# Patient Record
Sex: Male | Born: 1960
Health system: Southern US, Community
[De-identification: ages and names within clinical notes are randomized; demographics above are authoritative.]

## PROBLEM LIST (undated history)

## (undated) DIAGNOSIS — M5124 Other intervertebral disc displacement, thoracic region: Secondary | ICD-10-CM

## (undated) DIAGNOSIS — Z87442 Personal history of urinary calculi: Secondary | ICD-10-CM

## (undated) DIAGNOSIS — M1990 Unspecified osteoarthritis, unspecified site: Secondary | ICD-10-CM

## (undated) DIAGNOSIS — K219 Gastro-esophageal reflux disease without esophagitis: Secondary | ICD-10-CM

## (undated) DIAGNOSIS — M549 Dorsalgia, unspecified: Secondary | ICD-10-CM

## (undated) DIAGNOSIS — J302 Other seasonal allergic rhinitis: Secondary | ICD-10-CM

## (undated) DIAGNOSIS — G2581 Restless legs syndrome: Secondary | ICD-10-CM

## (undated) DIAGNOSIS — G4730 Sleep apnea, unspecified: Secondary | ICD-10-CM

## (undated) DIAGNOSIS — G8929 Other chronic pain: Secondary | ICD-10-CM

## (undated) DIAGNOSIS — IMO0001 Reserved for inherently not codable concepts without codable children: Secondary | ICD-10-CM

## (undated) DIAGNOSIS — R7303 Prediabetes: Secondary | ICD-10-CM

## (undated) DIAGNOSIS — E119 Type 2 diabetes mellitus without complications: Secondary | ICD-10-CM

## (undated) HISTORY — PX: LITHOTRIPSY: SUR834

## (undated) HISTORY — PX: WISDOM TOOTH EXTRACTION: SHX21

## (undated) HISTORY — PX: ELBOW ARTHROSCOPY W/ SYNOVECTOMY: SHX1491

---

## 2001-11-01 ENCOUNTER — Ambulatory Visit (HOSPITAL_COMMUNITY): Admission: RE | Admit: 2001-11-01 | Discharge: 2001-11-01 | Payer: Self-pay | Admitting: Internal Medicine

## 2003-12-22 ENCOUNTER — Ambulatory Visit (HOSPITAL_COMMUNITY): Admission: RE | Admit: 2003-12-22 | Discharge: 2003-12-22 | Payer: Self-pay | Admitting: Pulmonary Disease

## 2004-10-20 ENCOUNTER — Emergency Department (HOSPITAL_COMMUNITY): Admission: EM | Admit: 2004-10-20 | Discharge: 2004-10-20 | Payer: Self-pay | Admitting: Emergency Medicine

## 2005-07-05 ENCOUNTER — Ambulatory Visit (HOSPITAL_COMMUNITY): Admission: RE | Admit: 2005-07-05 | Discharge: 2005-07-05 | Payer: Self-pay | Admitting: Pulmonary Disease

## 2005-10-13 ENCOUNTER — Emergency Department (HOSPITAL_COMMUNITY): Admission: EM | Admit: 2005-10-13 | Discharge: 2005-10-13 | Payer: Self-pay | Admitting: Emergency Medicine

## 2006-02-13 ENCOUNTER — Ambulatory Visit (HOSPITAL_COMMUNITY): Admission: RE | Admit: 2006-02-13 | Discharge: 2006-02-13 | Payer: Self-pay | Admitting: Pulmonary Disease

## 2007-04-15 ENCOUNTER — Emergency Department (HOSPITAL_COMMUNITY): Admission: EM | Admit: 2007-04-15 | Discharge: 2007-04-15 | Payer: Self-pay | Admitting: Emergency Medicine

## 2008-07-16 ENCOUNTER — Ambulatory Visit (HOSPITAL_COMMUNITY): Admission: RE | Admit: 2008-07-16 | Discharge: 2008-07-16 | Payer: Self-pay | Admitting: Urology

## 2008-07-17 ENCOUNTER — Ambulatory Visit (HOSPITAL_COMMUNITY): Admission: RE | Admit: 2008-07-17 | Discharge: 2008-07-17 | Payer: Self-pay | Admitting: Urology

## 2008-07-21 ENCOUNTER — Ambulatory Visit (HOSPITAL_COMMUNITY): Admission: RE | Admit: 2008-07-21 | Discharge: 2008-07-21 | Payer: Self-pay

## 2008-07-28 ENCOUNTER — Ambulatory Visit (HOSPITAL_COMMUNITY): Admission: RE | Admit: 2008-07-28 | Discharge: 2008-07-28 | Payer: Self-pay | Admitting: Urology

## 2009-01-02 ENCOUNTER — Ambulatory Visit: Admission: RE | Admit: 2009-01-02 | Discharge: 2009-01-02 | Payer: Self-pay | Admitting: Pulmonary Disease

## 2009-08-15 HISTORY — PX: CYSTOSCOPY/RETROGRADE/URETEROSCOPY/STONE EXTRACTION WITH BASKET: SHX5317

## 2010-12-31 NOTE — Procedures (Signed)
Jesus Ingram, Jesus Ingram             ACCOUNT NO.:  [id number]   MEDICAL RECORD NO.:  [id number]          PATIENT TYPE:  OUT   LOCATION:  SLEE                          FACILITY:  [facility]   PHYSICIAN:  Kofi A. Gerilyn Pilgrim, M.D. DATE OF BIRTH:  Aug 06, 1961   DATE OF PROCEDURE:  DATE OF DISCHARGE:  01/02/2009                             SLEEP DISORDER REPORT   NOCTURNAL POLYSOMNOGRAPHY REPORT.   REFERRING PHYSICIAN:  Edward L. Juanetta Gosling, MD   INDICATIONS:  This is a 50 year old who presents with snoring and  hypersomnia.  He is being evaluated for obstructive sleep apnea  syndrome.   MEDICATION:  Metformin, magnesium.   Epworth sleepiness scale of 13.  BMI of 32.   ARCHITECTURAL SUMMARY:  This is a split night recording with the first  portion being a diagnostic and the second a titration study.  The total  recording time is 404 minutes.  The sleep efficiency is 77%.  Sleep  latency is 18 minutes.  REM latency 240 minutes.   RESPIRATORY SUMMARY:  Baseline oxygen saturation is 95%.  Lowest  saturation 81%.  The diagnostic AHI is 80.  The patient was titrated  between pressures of 5 and 10 with the optimal pressure being 10, which  the patient tolerated very well.   LIMB MOVEMENT SUMMARY:  PLM index is 20.   ELECTROCARDIOGRAM SUMMARY:  Average heart rate 73 with rare PVCs  observed.   IMPRESSION:  1. Severe obstructive sleep apnea syndrome, which responds well to a      CPAP of 10.  2. Moderate periodic limb movement disorder of sleep.      Kofi A. Gerilyn Pilgrim, M.D.  Electronically Signed     KAD/MEDQ  D:  01/10/2009  T:  01/11/2009  Job:  [id number]

## 2010-12-31 NOTE — Op Note (Signed)
[facility]  Patient:    Jesus Ingram, FRASIER Visit Number: [id number] MRN: [id number]          Service Type: END Location: DAY Attending Physician:  Jonathon Bellows Dictated by:   Roetta Sessions, M.D. Proc. Date: 11/01/01 Admit Date:  11/01/2001 Discharge Date: 11/01/2001   CC:         Kari Baars, M.D.   Operative Report  PROCEDURE:  Diagnostic esophagogastroduodenoscopy.  INDICATIONS FOR PROCEDURE:  The patient is a pleasant 50 year old Caucasian male with long-standing gastroesophageal reflux symptoms.  He was intolerant to Aciphex.  He takes Rolaids every day.  Given the long-standing nature and recent crescendo characteristic to his reflux symptoms, EGD is now being performed. This patient was seen in the office yesterday at the request of Dr. Juanetta Gosling.  I discussed the approach of EGD with him.  Potential risks, benefits, and alternatives have been reviewed and questions answered.  He is agreeable.  Please see my handwritten H&P for more information.  He appeas low risk for conscious sedation.  PROCEDURE NOTE:  O2 saturation, blood pressure, and pulse for this patient were monitored for the entire procedure.  Conscious sedation with Versed 3 mg IV and Demerol 75 mg IV, and Cetacaine spray for topical oropharyngeal anesthesia.  INSTRUMENT:  Olympus video gastroscope.  FINDINGS:  Examination of the tubular esophagus revealed multiple distal esophageal erosions in a circumferential distribution.  They were all within 1 cm of the EGD junction.  There was no evidence of Barretts esophagus.  The remainder of the esophageal mucosa appeared normal.  The EGD junction was easily traversed.  Stomach:  The gastric cavity was emptied, insufflated with air, and thorough examination of the gastric mucosa including retroflex view of the proximal stomach and esophagogastric junction revealed no abnormalities.  The pylorus was patent and easily  traversed.  Duodenum: The duodenal bulb and second portion appeared normal.  THERAPEUTIC/DIAGNOSTIC MANEUVERS PERFORMED:  None.  The patient tolerated the procedure well.  IMPRESSION:  Distal esophageal erosions consistent with erosive reflux esophagitis  The remainder of the upper gastrointestinal tract appeared normal.  RECOMMENDATIONS: 1. Anti-reflux measures emphasized. 2. The patient is to go to my office for Nexium samples 40 mg orally daily    for the next month. 3. Office appointment with [place] in one month to assess his progress. Dictated by:   Roetta Sessions, M.D. Attending Physician:  Jonathon Bellows DD:  11/01/01 TD:  11/03/01 Job: [id number]

## 2011-10-12 LAB — HEMOGLOBIN A1C: Hemoglobin A1C: 6.6

## 2011-12-29 ENCOUNTER — Encounter (HOSPITAL_COMMUNITY): Payer: Self-pay

## 2012-01-03 ENCOUNTER — Encounter (HOSPITAL_COMMUNITY)
Admission: RE | Admit: 2012-01-03 | Discharge: 2012-01-03 | Disposition: A | Discharge status: Home / Self Care | Payer: 59 | Source: Ambulatory Visit | Attending: Ophthalmology | Admitting: Ophthalmology

## 2012-01-03 ENCOUNTER — Encounter (HOSPITAL_COMMUNITY): Payer: Self-pay

## 2012-01-03 HISTORY — DX: Gastro-esophageal reflux disease without esophagitis: K21.9

## 2012-01-03 HISTORY — DX: Personal history of urinary calculi: Z87.442

## 2012-01-03 HISTORY — DX: Other seasonal allergic rhinitis: J30.2

## 2012-01-03 HISTORY — DX: Other intervertebral disc displacement, thoracic region: M51.24

## 2012-01-03 HISTORY — DX: Dorsalgia, unspecified: M54.9

## 2012-01-03 HISTORY — DX: Other chronic pain: G89.29

## 2012-01-03 HISTORY — DX: Prediabetes: R73.03

## 2012-01-03 HISTORY — DX: Reserved for inherently not codable concepts without codable children: IMO0001

## 2012-01-03 LAB — BASIC METABOLIC PANEL
BUN: 10 mg/dL (ref 6–23)
CO2: 27 mEq/L (ref 19–32)
Calcium: 9.9 mg/dL (ref 8.4–10.5)
Chloride: 98 mEq/L (ref 96–112)
Creatinine, Ser: 1.14 mg/dL (ref 0.50–1.35)
GFR calc Af Amer: 86 mL/min — ABNORMAL LOW (ref >90)
GFR calc non Af Amer: 74 mL/min — ABNORMAL LOW (ref >90)
Glucose, Bld: 211 mg/dL — ABNORMAL HIGH (ref 70–99)
Potassium: 4.2 mEq/L (ref 3.5–5.1)

## 2012-01-03 LAB — HEMOGLOBIN AND HEMATOCRIT, BLOOD: Hemoglobin: 14.8 g/dL (ref 13.0–17.0)

## 2012-01-03 NOTE — Patient Instructions (Signed)
Your procedure is scheduled on:  Tuesday, 01/10/12  Report to [name] at     0630   AM.  Call this number if you have problems the morning of surgery: [phone]   Remember:   Do not eat or drink   After Midnight.  Take these medicines the morning of surgery with A SIP OF WATER:  none   Do not wear jewelry, make-up or nail polish.  Do not wear lotions, powders, or perfumes. You may wear deodorant.  Do not bring valuables to the hospital.  Contacts, dentures or bridgework may not be worn into surgery.     Patients discharged the day of surgery will not be allowed to drive home.  Name and phone number of your driver: driver  Special Instructions: Use eye drops as directed.   Please read over the following fact sheets that you were given: Pain Booklet, Anesthesia Post-op Instructions and Care and Recovery After Surgery    Cataract Surgery  A cataract is a clouding of the lens of the eye. When a lens becomes cloudy, vision is reduced based on the degree and nature of the clouding. Surgery may be needed to improve vision. Surgery removes the cloudy lens and usually replaces it with a substitute lens (intraocular lens, IOL). LET YOUR EYE DOCTOR KNOW ABOUT:  Allergies to food or medicine.   Medicines taken including herbs, eyedrops, over-the-counter medicines, and creams.   Use of steroids (by mouth or creams).   Previous problems with anesthetics or numbing medicine.   History of bleeding problems or blood clots.   Previous surgery.   Other health problems, including diabetes and kidney problems.   Possibility of pregnancy, if this applies.  RISKS AND COMPLICATIONS  Infection.   Inflammation of the eyeball (endophthalmitis) that can spread to both eyes (sympathetic ophthalmia).   Poor wound healing.   If an IOL is inserted, it can later fall out of proper position. This is very uncommon.   Clouding of the part of your eye that holds an IOL in place. This is called an  "after-cataract." These are uncommon, but easily treated.  BEFORE THE PROCEDURE  Do not eat or drink anything except small amounts of water for 8 to 12 before your surgery, or as directed by your caregiver.   Unless you are told otherwise, continue any eyedrops you have been prescribed.   Talk to your primary caregiver about all other medicines that you take (both prescription and non-prescription). In some cases, you may need to stop or change medicines near the time of your surgery. This is most important if you are taking blood-thinning medicine.Do not stop medicines unless you are told to do so.   Arrange for someone to drive you to and from the procedure.   Do not put contact lenses in either eye on the day of your surgery.  PROCEDURE There is more than one method for safely removing a cataract. Your doctor can explain the differences and help determine which is best for you. Phacoemulsification surgery is the most common form of cataract surgery.  An injection is given behind the eye or eyedrops are given to make this a painless procedure.   A small cut (incision) is made on the edge of the clear, dome-shaped surface that covers the front of the eye (cornea).   A tiny probe is painlessly inserted into the eye. This device gives off ultrasound waves that soften and break up the cloudy center of the lens. This makes  it easier for the cloudy lens to be removed by suction.   An IOL may be implanted.   The normal lens of the eye is covered by a clear capsule. Part of that capsule is intentionally left in the eye to support the IOL.   Your surgeon may or may not use stitches to close the incision.  There are other forms of cataract surgery that require a larger incision and stiches to close the eye. This approach is taken in cases where the doctor feels that the cataract cannot be easily removed using phacoemulsification. AFTER THE PROCEDURE  When an IOL is implanted, it does not need  care. It becomes a permanent part of your eye and cannot be seen or felt.   Your doctor will schedule follow-up exams to check on your progress.   Review your other medicines with your doctor to see which can be resumed after surgery.   Use eyedrops or take medicine as prescribed by your doctor.  Document Released: 07/21/2011 Document Reviewed: 07/18/2011 [facility] Patient Information 2012 [place], [place].  PATIENT INSTRUCTIONS POST-ANESTHESIA  IMMEDIATELY FOLLOWING SURGERY:  Do not drive or operate machinery for the first twenty four hours after surgery.  Do not make any important decisions for twenty four hours after surgery or while taking narcotic pain medications or sedatives.  If you develop intractable nausea and vomiting or a severe headache please notify your doctor immediately.  FOLLOW-UP:  Please make an appointment with your surgeon as instructed. You do not need to follow up with anesthesia unless specifically instructed to do so.  WOUND CARE INSTRUCTIONS (if applicable):  Keep a dry clean dressing on the anesthesia/puncture wound site if there is drainage.  Once the wound has quit draining you may leave it open to air.  Generally you should leave the bandage intact for twenty four hours unless there is drainage.  If the epidural site drains for more than 36-48 hours please call the anesthesia department.  QUESTIONS?:  Please feel free to call your physician or the hospital operator if you have any questions, and they will be happy to assist you.

## 2012-01-10 ENCOUNTER — Encounter (HOSPITAL_COMMUNITY): Payer: Self-pay | Admitting: *Deleted

## 2012-01-10 ENCOUNTER — Ambulatory Visit (HOSPITAL_COMMUNITY)
Admission: RE | Admit: 2012-01-10 | Discharge: 2012-01-10 | Disposition: A | Discharge status: Home / Self Care | Payer: 59 | Source: Ambulatory Visit | Attending: Ophthalmology | Admitting: Ophthalmology

## 2012-01-10 ENCOUNTER — Encounter (HOSPITAL_COMMUNITY): Payer: Self-pay | Admitting: Anesthesiology

## 2012-01-10 ENCOUNTER — Ambulatory Visit (HOSPITAL_COMMUNITY): Payer: 59 | Admitting: Anesthesiology

## 2012-01-10 ENCOUNTER — Encounter (HOSPITAL_COMMUNITY)
Admission: RE | Disposition: A | Discharge status: Home / Self Care | Payer: Self-pay | Source: Ambulatory Visit | Attending: Ophthalmology

## 2012-01-10 DIAGNOSIS — H2510 Age-related nuclear cataract, unspecified eye: Secondary | ICD-10-CM | POA: Insufficient documentation

## 2012-01-10 DIAGNOSIS — E119 Type 2 diabetes mellitus without complications: Secondary | ICD-10-CM | POA: Insufficient documentation

## 2012-01-10 DIAGNOSIS — Z01812 Encounter for preprocedural laboratory examination: Secondary | ICD-10-CM | POA: Insufficient documentation

## 2012-01-10 HISTORY — PX: CATARACT EXTRACTION W/PHACO: SHX586

## 2012-01-10 LAB — GLUCOSE, CAPILLARY: Glucose-Capillary: 111 mg/dL — ABNORMAL HIGH (ref 70–99)

## 2012-01-10 SURGERY — PHACOEMULSIFICATION, CATARACT, WITH IOL INSERTION
Anesthesia: Monitor Anesthesia Care | Site: Eye | Laterality: Right | Wound class: Clean

## 2012-01-10 MED ORDER — PHENYLEPHRINE HCL 2.5 % OP SOLN
1.0000 [drp] | OPHTHALMIC | Status: AC
  Administered 2012-01-10 (×3): 1 [drp] via OPHTHALMIC

## 2012-01-10 MED ORDER — CYCLOPENTOLATE-PHENYLEPHRINE 0.2-1 % OP SOLN
OPHTHALMIC | Status: AC
  Filled 2012-01-10: qty 2

## 2012-01-10 MED ORDER — PHENYLEPHRINE HCL 2.5 % OP SOLN
OPHTHALMIC | Status: AC
  Filled 2012-01-10: qty 2

## 2012-01-10 MED ORDER — CYCLOPENTOLATE-PHENYLEPHRINE 0.2-1 % OP SOLN
1.0000 [drp] | OPHTHALMIC | Status: AC
  Administered 2012-01-10 (×3): 1 [drp] via OPHTHALMIC

## 2012-01-10 MED ORDER — LIDOCAINE 3.5 % OP GEL OPTIME - NO CHARGE: OPHTHALMIC | Status: DC | PRN

## 2012-01-10 MED ORDER — TETRACAINE 0.5 % OP SOLN OPTIME - NO CHARGE
OPHTHALMIC | Status: DC | PRN
  Administered 2012-01-10: 2 [drp] via OPHTHALMIC

## 2012-01-10 MED ORDER — MIDAZOLAM HCL 2 MG/2ML IJ SOLN
INTRAMUSCULAR | Status: AC
  Administered 2012-01-10: 2 mg via INTRAVENOUS
  Filled 2012-01-10: qty 2

## 2012-01-10 MED ORDER — BSS IO SOLN
INTRAOCULAR | Status: DC | PRN
  Administered 2012-01-10: 15 mL via INTRAOCULAR

## 2012-01-10 MED ORDER — PROVISC 10 MG/ML IO SOLN
INTRAOCULAR | Status: DC | PRN
  Administered 2012-01-10: 8.5 mg via INTRAOCULAR

## 2012-01-10 MED ORDER — TETRACAINE HCL 0.5 % OP SOLN
1.0000 [drp] | OPHTHALMIC | Status: AC
  Administered 2012-01-10 (×3): 1 [drp] via OPHTHALMIC

## 2012-01-10 MED ORDER — LACTATED RINGERS IV SOLN
INTRAVENOUS | Status: DC
  Administered 2012-01-10: 08:00:00 via INTRAVENOUS

## 2012-01-10 MED ORDER — MIDAZOLAM HCL 2 MG/2ML IJ SOLN
1.0000 mg | INTRAMUSCULAR | Status: DC | PRN
Start: 2012-01-10 — End: 2012-01-10
  Administered 2012-01-10: 2 mg via INTRAVENOUS

## 2012-01-10 MED ORDER — FLURBIPROFEN SODIUM 0.03 % OP SOLN
OPHTHALMIC | Status: AC
  Filled 2012-01-10: qty 2.5

## 2012-01-10 MED ORDER — FLURBIPROFEN SODIUM 0.03 % OP SOLN
1.0000 [drp] | OPHTHALMIC | Status: AC
  Administered 2012-01-10 (×3): 1 [drp] via OPHTHALMIC

## 2012-01-10 MED ORDER — TETRACAINE HCL 0.5 % OP SOLN
OPHTHALMIC | Status: AC
  Filled 2012-01-10: qty 2

## 2012-01-10 MED ORDER — EPINEPHRINE HCL 1 MG/ML IJ SOLN
INTRAOCULAR | Status: DC | PRN
  Administered 2012-01-10: 08:00:00

## 2012-01-10 SURGICAL SUPPLY — 23 items
CAPSULAR TENSION RING-AMO (OPHTHALMIC RELATED) IMPLANT
CLOTH BEACON ORANGE TIMEOUT ST (SAFETY) ×1 IMPLANT
EYE SHIELD UNIVERSAL CLEAR (GAUZE/BANDAGES/DRESSINGS) ×2 IMPLANT
GLOVE BIO SURGEON STRL SZ 6.5 (GLOVE) ×1 IMPLANT
GLOVE ECLIPSE 6.5 STRL STRAW (GLOVE) IMPLANT
GLOVE ECLIPSE 7.0 STRL STRAW (GLOVE) IMPLANT
GLOVE EXAM NITRILE LRG STRL (GLOVE) IMPLANT
GLOVE EXAM NITRILE MD LF STRL (GLOVE) ×1 IMPLANT
GLOVE SKINSENSE NS SZ6.5 (GLOVE)
GLOVE SKINSENSE STRL SZ6.5 (GLOVE) IMPLANT
HEALON 5 0.6 ML (INTRAOCULAR LENS) IMPLANT
KIT VITRECTOMY (OPHTHALMIC RELATED) IMPLANT
PAD ARMBOARD 7.5X6 YLW CONV (MISCELLANEOUS) ×1 IMPLANT
PROC W NO LENS (INTRAOCULAR LENS)
PROC W SPEC LENS (INTRAOCULAR LENS)
PROCESS W NO LENS (INTRAOCULAR LENS) IMPLANT
PROCESS W SPEC LENS (INTRAOCULAR LENS) IMPLANT
RING MALYGIN (MISCELLANEOUS) IMPLANT
SIGHTPATH CAT PROC W REG LENS (Ophthalmic Related) ×2 IMPLANT
TAPE SURG TRANSPORE 1 IN (GAUZE/BANDAGES/DRESSINGS) IMPLANT
TAPE SURGICAL TRANSPORE 1 IN (GAUZE/BANDAGES/DRESSINGS) ×1
VISCOELASTIC ADDITIONAL (OPHTHALMIC RELATED) IMPLANT
WATER STERILE IRR 250ML POUR (IV SOLUTION) ×1 IMPLANT

## 2012-01-10 NOTE — Anesthesia Postprocedure Evaluation (Signed)
  Anesthesia Post-op Note  Patient: Jesus Ingram  Procedure(s) Performed: Procedure(s) (LRB): CATARACT EXTRACTION PHACO AND INTRAOCULAR LENS PLACEMENT (IOC) (Right)  Patient Location:  Short Stay  Anesthesia Type: MAC  Level of Consciousness: awake  Airway and Oxygen Therapy: Patient Spontanous Breathing  Post-op Pain: none  Post-op Assessment: Post-op Vital signs reviewed, Patient's Cardiovascular Status Stable, Respiratory Function Stable, Patent Airway, No signs of Nausea or vomiting and Pain level controlled  Post-op Vital Signs: Reviewed and stable  Complications: No apparent anesthesia complications

## 2012-01-10 NOTE — Op Note (Signed)
Patient brought to the operating room and prepped and draped in the usual manner.  Lid speculum inserted in right eye.  Stab incision made at the twelve o'clock position.  Provisc instilled in the anterior chamber.   A 2.4 mm. Stab incision was made temporally.  An anterior capsulotomy was done with a bent 25 gauge needle.  The nucleus was hydrodissected.  The Phaco tip was inserted in the anterior chamber and the nucleus was emulsified.  CDE was 9.77.  The cortical material was then removed with the I and A tip.  Posterior capsule was the polished.  The anterior chamber was deepened with Provisc.  A 21.0 Diopter Rayner 570C IOL was then inserted in the capsular bag.  Provisc was then removed with the I and A tip.  The wound was then hydrated.  Patient sent to the Recovery Room in good condition with follow up in my office.

## 2012-01-10 NOTE — Discharge Instructions (Signed)
Jesus Ingram  01/10/2012           [facility] Instructions [address] [place] [address]      1. Avoid closing eyes tightly. One often closes the eye tightly when laughing, talking, sneezing, coughing or if they feel irritated. At these times, you should be careful not to close your eyes tightly.  2. Instill eye drops as instructed. To instill drops in your eye, open it, look up and have someone gently pull the lower lid down and instill a couple of drops inside the lower lid.  3. Do not touch upper lid.  4. Take Advil or Tylenol for pain.  5. You may use either eye for near work, such as reading or sewing and you may watch television.  6. You may have your hair done at the beauty parlor at any time.  7. Wear dark glasses with or without your own glasses if you are in bright light.  8. Call our office at [phone] or [phone] if you have sharp pain in your eye or unusual symptoms.  9. Do not be concerned because vision in the operative eye is not good. It will not be good, no matter how successful the operation, until you get a special lens for it. Your old glasses will not be suited to the new eye that was operated on and you will not be ready for a new lens for about a month.  10. Follow up at the [facility] office today @ 2:30-3:00pm.    I have received a copy of the above instructions and will follow them.

## 2012-01-10 NOTE — H&P (Signed)
The patient was re examined and there is no change in the patients condition since the original H and P. 

## 2012-01-10 NOTE — Anesthesia Procedure Notes (Signed)
Procedure Name: MAC Date/Time: 01/10/2012 8:00 AM Performed by: Franco Nones Pre-anesthesia Checklist: Patient identified, Emergency Drugs available, Suction available, Timeout performed and Patient being monitored Patient Re-evaluated:Patient Re-evaluated prior to inductionOxygen Delivery Method: Nasal Cannula

## 2012-01-10 NOTE — Transfer of Care (Signed)
Immediate Anesthesia Transfer of Care Note  Patient: Jesus Ingram  Procedure(s) Performed: Procedure(s) (LRB): CATARACT EXTRACTION PHACO AND INTRAOCULAR LENS PLACEMENT (IOC) (Right)  Patient Location: Shortstay  Anesthesia Type: MAC  Level of Consciousness: awake  Airway & Oxygen Therapy: Patient Spontanous Breathing   Post-op Assessment: Report given to PACU RN, Post -op Vital signs reviewed and stable and Patient moving all extremities  Post vital signs: Reviewed and stable  Complications: No apparent anesthesia complications

## 2012-01-10 NOTE — Anesthesia Preprocedure Evaluation (Addendum)
Anesthesia Evaluation  Patient identified by MRN, date of birth, ID band Patient awake    History of Anesthesia Complications Negative for: history of anesthetic complications  Airway Mallampati: II      Dental  (+) Teeth Intact   Pulmonary neg pulmonary ROS,  breath sounds clear to auscultation        Cardiovascular negative cardio ROS  Rhythm:Regular     Neuro/Psych    GI/Hepatic GERD-  Medicated and Controlled,  Endo/Other  Diabetes mellitus-, Well Controlled, Type 2  Renal/GU      Musculoskeletal   Abdominal   Peds  Hematology   Anesthesia Other Findings   Reproductive/Obstetrics                          Anesthesia Physical Anesthesia Plan  ASA: II  Anesthesia Plan: MAC   Post-op Pain Management:    Induction: Intravenous  Airway Management Planned: Nasal Cannula  Additional Equipment:   Intra-op Plan:   Post-operative Plan:   Informed Consent: I have reviewed the patients History and Physical, chart, labs and discussed the procedure including the risks, benefits and alternatives for the proposed anesthesia with the patient or authorized representative who has indicated his/her understanding and acceptance.     Plan Discussed with:   Anesthesia Plan Comments:         Anesthesia Quick Evaluation

## 2012-01-12 ENCOUNTER — Encounter (HOSPITAL_COMMUNITY): Payer: Self-pay | Admitting: Ophthalmology

## 2012-03-20 LAB — LIPID PANEL
Cholesterol: 163 (ref 0–200)
HDL: 40 (ref 35–70)
LDL Cholesterol: 78
Triglycerides: 224 — AB (ref 40–160)

## 2012-03-20 LAB — BASIC METABOLIC PANEL
BUN: 15 (ref 4–21)
Creatinine: 1.1 (ref <=1.3)
Glucose: 164
Potassium: 4.4 (ref 3.4–5.3)
Sodium: 140 (ref 137–147)

## 2012-03-20 LAB — HEPATIC FUNCTION PANEL
ALT: 12 (ref 10–40)
AST: 13 — AB (ref 14–40)
Alkaline Phosphatase: 51 (ref 25–125)

## 2012-03-20 LAB — CBC AND DIFFERENTIAL
HCT: 43 (ref 41–53)
Hemoglobin: 15.1 (ref 13.5–17.5)
Platelets: 215 (ref 150–399)
WBC: 6.9

## 2012-03-20 LAB — PSA: PSA: 1.07

## 2012-03-20 LAB — HEMOGLOBIN A1C: Hemoglobin A1C: 6.3

## 2012-03-20 LAB — TSH: TSH: 1.34 (ref 0.41–5.90)

## 2012-03-20 LAB — COMPREHENSIVE METABOLIC PANEL: Calcium: 9.6 (ref 8.7–10.7)

## 2012-04-23 ENCOUNTER — Ambulatory Visit (HOSPITAL_COMMUNITY)
Admission: RE | Admit: 2012-04-23 | Discharge: 2012-04-23 | Disposition: A | Discharge status: Home / Self Care | Payer: 59 | Source: Ambulatory Visit | Attending: Pulmonary Disease | Admitting: Pulmonary Disease

## 2012-04-23 ENCOUNTER — Other Ambulatory Visit (HOSPITAL_COMMUNITY): Payer: Self-pay | Admitting: Pulmonary Disease

## 2012-04-23 DIAGNOSIS — M79673 Pain in unspecified foot: Secondary | ICD-10-CM

## 2012-04-23 DIAGNOSIS — M79609 Pain in unspecified limb: Secondary | ICD-10-CM | POA: Insufficient documentation

## 2012-04-23 DIAGNOSIS — M7730 Calcaneal spur, unspecified foot: Secondary | ICD-10-CM | POA: Insufficient documentation

## 2012-11-12 LAB — HEMOGLOBIN A1C: Hemoglobin A1C: 6.5

## 2012-11-20 ENCOUNTER — Encounter (INDEPENDENT_AMBULATORY_CARE_PROVIDER_SITE_OTHER): Payer: Self-pay | Admitting: *Deleted

## 2012-12-03 ENCOUNTER — Encounter (HOSPITAL_COMMUNITY): Payer: Self-pay | Admitting: Emergency Medicine

## 2012-12-03 ENCOUNTER — Emergency Department (HOSPITAL_COMMUNITY)
Admission: EM | Admit: 2012-12-03 | Discharge: 2012-12-04 | Disposition: A | Discharge status: Home / Self Care | Payer: 59 | Attending: Emergency Medicine | Admitting: Emergency Medicine

## 2012-12-03 ENCOUNTER — Emergency Department (HOSPITAL_COMMUNITY): Payer: 59

## 2012-12-03 DIAGNOSIS — Z8639 Personal history of other endocrine, nutritional and metabolic disease: Secondary | ICD-10-CM | POA: Insufficient documentation

## 2012-12-03 DIAGNOSIS — Z8739 Personal history of other diseases of the musculoskeletal system and connective tissue: Secondary | ICD-10-CM | POA: Insufficient documentation

## 2012-12-03 DIAGNOSIS — M542 Cervicalgia: Secondary | ICD-10-CM | POA: Insufficient documentation

## 2012-12-03 DIAGNOSIS — K219 Gastro-esophageal reflux disease without esophagitis: Secondary | ICD-10-CM | POA: Insufficient documentation

## 2012-12-03 DIAGNOSIS — R51 Headache: Secondary | ICD-10-CM | POA: Insufficient documentation

## 2012-12-03 DIAGNOSIS — M549 Dorsalgia, unspecified: Secondary | ICD-10-CM | POA: Insufficient documentation

## 2012-12-03 DIAGNOSIS — R1310 Dysphagia, unspecified: Secondary | ICD-10-CM | POA: Insufficient documentation

## 2012-12-03 DIAGNOSIS — Z87442 Personal history of urinary calculi: Secondary | ICD-10-CM | POA: Insufficient documentation

## 2012-12-03 DIAGNOSIS — Z862 Personal history of diseases of the blood and blood-forming organs and certain disorders involving the immune mechanism: Secondary | ICD-10-CM | POA: Insufficient documentation

## 2012-12-03 DIAGNOSIS — G8929 Other chronic pain: Secondary | ICD-10-CM | POA: Insufficient documentation

## 2012-12-03 LAB — CBC WITH DIFFERENTIAL/PLATELET
Basophils Relative: 0 % (ref 0–1)
Eosinophils Relative: 4 % (ref 0–5)
HCT: 43.9 % (ref 39.0–52.0)
Hemoglobin: 15.6 g/dL (ref 13.0–17.0)
MCH: 30.3 pg (ref 26.0–34.0)
MCHC: 35.5 g/dL (ref 30.0–36.0)
MCV: 85.2 fL (ref 78.0–100.0)
Monocytes Absolute: 0.6 10*3/uL (ref 0.1–1.0)
Monocytes Relative: 6 % (ref 3–12)
Neutro Abs: 7.6 10*3/uL (ref 1.7–7.7)

## 2012-12-03 LAB — BASIC METABOLIC PANEL
BUN: 13 mg/dL (ref 6–23)
Calcium: 9.4 mg/dL (ref 8.4–10.5)
Chloride: 99 mEq/L (ref 96–112)
Creatinine, Ser: 1.05 mg/dL (ref 0.50–1.35)
GFR calc Af Amer: >90 mL/min (ref >90)

## 2012-12-03 MED ORDER — DIAZEPAM 5 MG PO TABS
10.0000 mg | ORAL_TABLET | Freq: Once | ORAL | Status: AC
  Administered 2012-12-03: 10 mg via ORAL
  Filled 2012-12-03: qty 2

## 2012-12-03 MED ORDER — IOHEXOL 300 MG/ML  SOLN
75.0000 mL | Freq: Once | INTRAMUSCULAR | Status: AC | PRN
  Administered 2012-12-03: 75 mL via INTRAVENOUS

## 2012-12-03 MED ORDER — OXYCODONE-ACETAMINOPHEN 5-325 MG PO TABS
1.0000 | ORAL_TABLET | Freq: Once | ORAL | Status: AC
  Administered 2012-12-03: 1 via ORAL
  Filled 2012-12-03: qty 1

## 2012-12-03 MED ORDER — HYDROMORPHONE HCL PF 1 MG/ML IJ SOLN
1.0000 mg | Freq: Once | INTRAMUSCULAR | Status: AC
  Administered 2012-12-03: 1 mg via INTRAVENOUS
  Filled 2012-12-03: qty 1

## 2012-12-03 NOTE — ED Provider Notes (Signed)
History  This chart was scribed for Jesus Lennert, MD by Greggory Stallion, ED Scribe. This patient was seen in room [facility] and the patient's care was started at 9:05 PM.   CSN: [id number]  Arrival date & time 12/03/12  1826     Chief Complaint  Patient presents with  . Neck Pain     Patient is a 52 y.o. male presenting with neck pain. The history is provided by the patient. No language interpreter was used.  Neck Pain Pain location:  Occipital region Quality:  Shooting Pain radiates to:  R shoulder and L shoulder (chest) Pain severity:  Moderate Pain is:  Same all the time Onset quality:  Sudden Duration:  1 day Timing:  Constant Progression:  Unchanged Chronicity:  New Context: not fall, not jumping from heights, not lifting a heavy object, not MCA, not MVA, not pedestrian accident and not recent injury   Relieved by:  Nothing Worsened by:  Swallowing (movement) Associated symptoms: headaches   Associated symptoms: no chest pain     SEANPATRICK Ingram is a 52 y.o. male who presents to the Emergency Department complaining of constant, severe,  shooting, neck pain onset 1 day ago. Pt states the pain radiates to his bilateral shoulders and chest. Pt states he put ice on his neck. He states it hurts to swallow. Pt states he took Aleve the first day without relief. He states he took an antiinflammatory this morning without relief. Pt denies fever, sore throat, visual disturbance, CP, cough, SOB, abdominal pain, nausea, emesis, diarrhea, urinary symptoms, back pain, HA, weakness, numbness and rash as associated symptoms.     Past Medical History  Diagnosis Date  . Borderline diabetes   . History of renal calculi   . Reflux     occasional, uses TUMS  . Seasonal allergies   . Chronic back pain   . Ruptured disc, thoracic     Past Surgical History  Procedure Laterality Date  . Wisdom tooth extraction    . Cystoscopy/retrograde/ureteroscopy/stone extraction with basket   2011    with stents, [facility]; [facility]  . Lithotripsy    . Cataract extraction w/phaco  01/10/2012    Procedure: CATARACT EXTRACTION PHACO AND INTRAOCULAR LENS PLACEMENT (IOC);  Surgeon: Loraine Leriche T. Nile Riggs, MD;  Location: [facility];  Service: Ophthalmology;  Laterality: Right;  CDE 9.77    History reviewed. No pertinent family history.  History  Substance Use Topics  . Smoking status: Never Smoker   . Smokeless tobacco: Not on file  . Alcohol Use: No      Review of Systems  Constitutional: Negative for appetite change and fatigue.  HENT: Positive for neck pain. Negative for congestion, sinus pressure and ear discharge.   Eyes: Negative for discharge.  Respiratory: Negative for cough.   Cardiovascular: Negative for chest pain.  Gastrointestinal: Negative for abdominal pain and diarrhea.  Genitourinary: Negative for frequency and hematuria.  Musculoskeletal: Negative for back pain.  Skin: Negative for rash.  Neurological: Positive for headaches. Negative for seizures.  Psychiatric/Behavioral: Negative for hallucinations.    Allergies  Review of patient's allergies indicates no known allergies.  Home Medications   Current Outpatient Rx  Name  Route  Sig  Dispense  Refill  . acetaminophen (TYLENOL) 500 MG tablet   Oral   Take 500 mg by mouth every 6 (six) hours as needed. For pain         . Melatonin 3 MG TABS   Oral   Take  1 tablet by mouth at bedtime as needed. For sleep         . pseudoephedrine-guaifenesin (MUCINEX D) 60-600 MG per tablet   Oral   Take 1 tablet by mouth every 12 (twelve) hours as needed. For allergies           Triage Vitals: BP 153/97  Pulse 84  Temp(Src) 97.7 F (36.5 C) (Oral)  Resp 20  Ht 6' (1.829 m)  Wt 230 lb (104.327 kg)  BMI 31.19 kg/m2  SpO2 100%  Physical Exam  Nursing note and vitals reviewed. Constitutional: He is oriented to person, place, and time. He appears well-developed.  HENT:  Head: Normocephalic.  Eyes: Conjunctivae  and EOM are normal. No scleral icterus.  Neck: No thyromegaly present.  Tender posterior bilateral neck.  Pain has pain with flexion and lateral movement  Cardiovascular: Normal rate and regular rhythm.  Exam reveals no gallop and no friction rub.   No murmur heard. Pulmonary/Chest: No stridor. He has no wheezes. He has no rales. He exhibits no tenderness.  Abdominal: He exhibits no distension. There is no tenderness. There is no rebound.  Musculoskeletal: Normal range of motion. He exhibits no edema.  Lymphadenopathy:    He has no cervical adenopathy.  Neurological: He is oriented to person, place, and time. Coordination normal.  Skin: No rash noted. No erythema.  Psychiatric: He has a normal mood and affect. His behavior is normal.    ED Course  Procedures (including critical care time)  DIAGNOSTIC STUDIES: Oxygen Saturation is 100% on RA, normal by my interpretation.    COORDINATION OF CARE: 9:05 PM-Discussed treatment plan which includes  Medications  HYDROmorphone (DILAUDID) injection 1 mg (not administered)  diazepam (VALIUM) tablet 10 mg (10 mg Oral Given 12/03/12 1944)  oxyCODONE-acetaminophen (PERCOCET/ROXICET) 5-325 MG per tablet 1 tablet (1 tablet Oral Given 12/03/12 1944)    soft tissue neck CT, CBC, and BMP with pt at bedside and pt agreed to plan.   Pt complains of pain in head worse will get ct head  Labs Reviewed - No data to display No results found.   No diagnosis found.    MDM  Muscular skeletal neck pain.  With pain with swallowing.  Will treat symptoms and have pt follow up with his md in 1-2 days for recheck    The chart was scribed for me under my direct supervision.  I personally performed the history, physical, and medical decision making and all procedures in the evaluation of this patient.Jesus Lennert, MD 12/04/12 [phone]

## 2012-12-03 NOTE — ED Notes (Addendum)
Patient complaining of neck pain radiating into bilateral shoulders since waking up yesterday. States pain is worse today. Denies injury.

## 2012-12-04 MED ORDER — CYCLOBENZAPRINE HCL 10 MG PO TABS: 10.0000 mg | ORAL_TABLET | Freq: Three times a day (TID) | ORAL | Status: DC | PRN

## 2012-12-04 MED ORDER — HYDROMORPHONE HCL PF 1 MG/ML IJ SOLN
1.0000 mg | Freq: Once | INTRAMUSCULAR | Status: AC
  Administered 2012-12-04: 1 mg via INTRAVENOUS
  Filled 2012-12-04: qty 1

## 2012-12-04 MED ORDER — OXYCODONE-ACETAMINOPHEN 5-325 MG PO TABS: 1.0000 | ORAL_TABLET | Freq: Four times a day (QID) | ORAL | Status: DC | PRN

## 2013-02-20 LAB — BASIC METABOLIC PANEL
BUN: 14 (ref 4–21)
CO2: 27 — AB (ref 13–22)
Creatinine: 1.3 (ref <=1.3)
Glucose: 123
Potassium: 4.3 (ref 3.4–5.3)
Sodium: 138 (ref 137–147)

## 2013-02-20 LAB — LIPID PANEL
Cholesterol: 167 (ref 0–200)
HDL: 36 (ref 35–70)
LDL Cholesterol: 74
Triglycerides: 283 — AB (ref 40–160)

## 2013-02-20 LAB — CBC AND DIFFERENTIAL
HCT: 42 (ref 41–53)
Hemoglobin: 14.6 (ref 13.5–17.5)
Platelets: 236 (ref 150–399)
WBC: 9.4

## 2013-02-20 LAB — HEMOGLOBIN A1C: Hemoglobin A1C: 6.1

## 2013-02-20 LAB — TSH: TSH: 0.95 (ref 0.41–5.90)

## 2013-02-20 LAB — COMPREHENSIVE METABOLIC PANEL: Calcium: 9.2 (ref 8.7–10.7)

## 2013-03-06 ENCOUNTER — Telehealth (INDEPENDENT_AMBULATORY_CARE_PROVIDER_SITE_OTHER): Payer: Self-pay | Admitting: *Deleted

## 2013-03-06 ENCOUNTER — Encounter (INDEPENDENT_AMBULATORY_CARE_PROVIDER_SITE_OTHER): Payer: Self-pay | Admitting: *Deleted

## 2013-03-06 ENCOUNTER — Other Ambulatory Visit (INDEPENDENT_AMBULATORY_CARE_PROVIDER_SITE_OTHER): Payer: Self-pay | Admitting: *Deleted

## 2013-03-06 DIAGNOSIS — Z1211 Encounter for screening for malignant neoplasm of colon: Secondary | ICD-10-CM

## 2013-03-06 MED ORDER — PEG-KCL-NACL-NASULF-NA ASC-C 100 G PO SOLR: 1.0000 | Freq: Once | ORAL | Status: DC

## 2013-03-06 NOTE — Telephone Encounter (Signed)
Patient needs movi prep 

## 2013-03-28 ENCOUNTER — Telehealth (INDEPENDENT_AMBULATORY_CARE_PROVIDER_SITE_OTHER): Payer: Self-pay | Admitting: *Deleted

## 2013-03-28 NOTE — Telephone Encounter (Signed)
  Procedure: tcs  Reason/Indication:  screening  Has patient had this procedure before?  no  If so, when, by whom and where?    Is there a family history of colon cancer?  no  Who?  What age when diagnosed?    Is patient diabetic?   Yes, diet control      Does patient have prosthetic heart valve?  no  Do you have a pacemaker?  no  Has patient ever had endocarditis? no  Has patient had joint replacement within last 12 months?  no  Is patient on Coumadin, Plavix and/or Aspirin? no  Medications: advil prn  Allergies:   Medication Adjustment:   Procedure date & time: 04/25/13 at 830

## 2013-03-28 NOTE — Telephone Encounter (Signed)
Agree 

## 2013-04-25 ENCOUNTER — Encounter (HOSPITAL_COMMUNITY): Payer: Self-pay | Admitting: *Deleted

## 2013-04-25 ENCOUNTER — Encounter (HOSPITAL_COMMUNITY)
Admission: RE | Disposition: A | Discharge status: Home / Self Care | Payer: Self-pay | Source: Ambulatory Visit | Attending: Internal Medicine

## 2013-04-25 ENCOUNTER — Ambulatory Visit (HOSPITAL_COMMUNITY)
Admission: RE | Admit: 2013-04-25 | Discharge: 2013-04-25 | Disposition: A | Discharge status: Home / Self Care | Payer: 59 | Source: Ambulatory Visit | Attending: Internal Medicine | Admitting: Internal Medicine

## 2013-04-25 DIAGNOSIS — Z1211 Encounter for screening for malignant neoplasm of colon: Secondary | ICD-10-CM

## 2013-04-25 HISTORY — DX: Gastro-esophageal reflux disease without esophagitis: K21.9

## 2013-04-25 HISTORY — PX: COLONOSCOPY: SHX5424

## 2013-04-25 LAB — HM COLONOSCOPY

## 2013-04-25 SURGERY — COLONOSCOPY
Anesthesia: Moderate Sedation

## 2013-04-25 MED ORDER — MIDAZOLAM HCL 5 MG/5ML IJ SOLN
INTRAMUSCULAR | Status: DC | PRN
  Administered 2013-04-25 (×2): 2 mg via INTRAVENOUS
  Administered 2013-04-25 (×2): 1 mg via INTRAVENOUS

## 2013-04-25 MED ORDER — MEPERIDINE HCL 50 MG/ML IJ SOLN
INTRAMUSCULAR | Status: AC
  Filled 2013-04-25: qty 1

## 2013-04-25 MED ORDER — MIDAZOLAM HCL 5 MG/5ML IJ SOLN
INTRAMUSCULAR | Status: AC
  Filled 2013-04-25: qty 10

## 2013-04-25 MED ORDER — SODIUM CHLORIDE 0.9 % IV SOLN
INTRAVENOUS | Status: DC
  Administered 2013-04-25: 08:00:00 via INTRAVENOUS

## 2013-04-25 MED ORDER — MEPERIDINE HCL 50 MG/ML IJ SOLN
INTRAMUSCULAR | Status: DC | PRN
  Administered 2013-04-25 (×2): 25 mg via INTRAVENOUS

## 2013-04-25 MED ORDER — SIMETHICONE 40 MG/0.6ML PO SUSP
ORAL | Status: DC | PRN
  Administered 2013-04-25: 08:00:00

## 2013-04-25 NOTE — Op Note (Signed)
COLONOSCOPY PROCEDURE REPORT  PATIENT:  Jesus Ingram  MR#:  [id number] Birthdate:  [birth year], 52 y.o., male Endoscopist:  Dr. Malissa Hippo, MD Referred By:  Dr. Oneal Deputy. Juanetta Gosling, MD Procedure Date: 04/25/2013  Procedure:   Colonoscopy  Indications:  Patient is 52 year old Caucasian male who is undergoing average risk screening colonoscopy.  Informed Consent:  The procedure and risks were reviewed with the patient and informed consent was obtained.  Medications:  Demerol 50 mg IV Versed 6 mg IV  Description of procedure:  After a digital rectal exam was performed, that colonoscope was advanced from the anus through the rectum and colon to the area of the cecum, ileocecal valve and appendiceal orifice. The cecum was deeply intubated. These structures were well-seen and photographed for the record. From the level of the cecum and ileocecal valve, the scope was slowly and cautiously withdrawn. The mucosal surfaces were carefully surveyed utilizing scope tip to flexion to facilitate fold flattening as needed. The scope was pulled down into the rectum where a thorough exam including retroflexion was performed. Terminal ileum was also examined.  Findings:   Prep excellent. Normal mucosa of terminal ileum. Normal mucosa of colon and rectum. Unremarkable anal rectal junction. Prep execellent Prep satisfactory.   Therapeutic/Diagnostic Maneuvers Performed:  None  Complications:  None  Cecal Withdrawal Time:  10 minutes  Impression:  Normal mucosa of terminal ileum. Normal colonoscopy.  Recommendations:  Standard instructions given. Next screening exam in 10 years.  REHMAN,NAJEEB U  04/25/2013 9:01 AM  CC: Dr. Fredirick Maudlin, MD & Dr. Bonnetta Barry ref. provider found

## 2013-04-25 NOTE — H&P (Signed)
Jesus Ingram is an 51 y.o. male.   Chief Complaint: Patient's here for colonoscopy. HPI: Patient is 52 year old Caucasian male who is here for screening colonoscopy. This is patient's first exam. He denies abdominal pain change in his 5 habits or rectal bleeding. Family history is negative for CRC.  Past Medical History  Diagnosis Date  . Borderline diabetes   . History of renal calculi   . Reflux     occasional, uses TUMS  . Seasonal allergies   . Chronic back pain   . Ruptured disc, thoracic   . GERD (gastroesophageal reflux disease)     Past Surgical History  Procedure Laterality Date  . Wisdom tooth extraction    . Cystoscopy/retrograde/ureteroscopy/stone extraction with basket  2011    with stents, [facility]; [place]  . Lithotripsy    . Cataract extraction w/phaco  01/10/2012    Procedure: CATARACT EXTRACTION PHACO AND INTRAOCULAR LENS PLACEMENT (IOC);  Surgeon: Loraine Leriche T. Nile Riggs, MD;  Location: [facility];  Service: Ophthalmology;  Laterality: Right;  CDE 9.77    Family History  Problem Relation Age of Onset  . Colon cancer Neg Hx    Social History:  reports that he has never smoked. His smokeless tobacco use includes Chew. He reports that he does not drink alcohol or use illicit drugs.  Allergies: No Known Allergies  Medications Prior to Admission  Medication Sig Dispense Refill  . acetaminophen (TYLENOL) 500 MG tablet Take 500 mg by mouth every 6 (six) hours as needed. For pain      . Melatonin 3 MG TABS Take 1 tablet by mouth once a week. Only takes rarely as needed For sleep      . naproxen sodium (ALEVE) 220 MG tablet Take 220 mg by mouth once as needed (for pain).      . peg 3350 powder (MOVIPREP) 100 G SOLR Take 1 kit (100 g total) by mouth once.  1 kit  0  . cyclobenzaprine (FLEXERIL) 10 MG tablet Take 1 tablet (10 mg total) by mouth 3 (three) times daily as needed for muscle spasms.  20 tablet  0  . methocarbamol (ROBAXIN) 750 MG tablet Take 750 mg by mouth once as  needed (for pain).      Marland Kitchen oxyCODONE-acetaminophen (PERCOCET/ROXICET) 5-325 MG per tablet Take 1 tablet by mouth every 6 (six) hours as needed for pain.  20 tablet  0    No results found for this or any previous visit (from the past 48 hour(s)). No results found.  ROS  Blood pressure 122/76, temperature 97.9 F (36.6 C), temperature source Oral, resp. rate 11, height 6' (1.829 m), weight 228 lb (103.42 kg), SpO2 97.00%. Physical Exam  Constitutional: He appears well-developed and well-nourished.  HENT:  Mouth/Throat: Oropharynx is clear and moist.  Eyes: Conjunctivae are normal.  Neck: No thyromegaly present.  Cardiovascular: Normal rate, regular rhythm and normal heart sounds.   No murmur heard. Respiratory: Effort normal and breath sounds normal.  GI: Soft. He exhibits no distension and no mass. There is no tenderness.  Musculoskeletal: He exhibits no edema.  Lymphadenopathy:    He has no cervical adenopathy.  Neurological: He is alert.  Skin: Skin is warm and dry.     Assessment/Plan Average risk screening colonoscopy.  Shealyn Sean U 04/25/2013, 8:26 AM

## 2013-04-26 ENCOUNTER — Encounter (HOSPITAL_COMMUNITY): Payer: Self-pay | Admitting: Internal Medicine

## 2013-05-30 LAB — COMPREHENSIVE METABOLIC PANEL: Calcium: 9.5 (ref 8.7–10.7)

## 2013-05-30 LAB — BASIC METABOLIC PANEL
BUN: 16 (ref 4–21)
CO2: 28 — AB (ref 13–22)
Chloride: 102 (ref 99–108)
Creatinine: 1.1 (ref <=1.3)
Glucose: 113
Potassium: 4.4 (ref 3.4–5.3)
Sodium: 141 (ref 137–147)

## 2013-05-30 LAB — HEMOGLOBIN A1C: Hemoglobin A1C: 6.6

## 2013-11-21 LAB — HEMOGLOBIN A1C: Hemoglobin A1C: 6.3

## 2014-03-04 LAB — HEMOGLOBIN A1C: Hemoglobin A1C: 6.6

## 2014-10-03 LAB — HEMOGLOBIN A1C: Hemoglobin A1C: 6.5

## 2015-01-08 NOTE — Patient Instructions (Signed)
Your procedure is scheduled on:  5/231/2015  [facility] at  11:30    AM.  Call this number if you have problems the morning of surgery: [phone]   Remember:   Do not eat or drink :After Midnight.      Do not wear jewelry, make-up or nail polish.  Do not wear lotions, powders, or perfumes. You may wear deodorant.  Do not shave 48 hours prior to surgery.  Do not bring valuables to the hospital.  Contacts, dentures or bridgework may not be worn into surgery.  Patients discharged the day of surgery will not be allowed to drive home.  Name and phone number of your driver:    Please read over the following fact sheets that you were given: Pain Booklet, Surgical Site Infection Prevention, Anesthesia Post-op Instructions and Care and Recovery After Surgery  Cataract Surgery  A cataract is a clouding of the lens of the eye. When a lens becomes cloudy, vision is reduced based on the degree and nature of the clouding. Surgery may be needed to improve vision. Surgery removes the cloudy lens and usually replaces it with a substitute lens (intraocular lens, IOL). LET YOUR EYE DOCTOR KNOW ABOUT:  Allergies to food or medicine.   Medicines taken including herbs, eyedrops, over-the-counter medicines, and creams.   Use of steroids (by mouth or creams).   Previous problems with anesthetics or numbing medicine.   History of bleeding problems or blood clots.   Previous surgery.   Other health problems, including diabetes and kidney problems.   Possibility of pregnancy, if this applies.  RISKS AND COMPLICATIONS  Infection.   Inflammation of the eyeball (endophthalmitis) that can spread to both eyes (sympathetic ophthalmia).   Poor wound healing.   If an IOL is inserted, it can later fall out of proper position. This is very uncommon.   Clouding of the part of your eye that holds an IOL in place. This is called an "after-cataract." These are uncommon, but easily treated.    BEFORE THE PROCEDURE  Do not eat or drink anything except small amounts of water for 8 to 12 before your surgery, or as directed by your caregiver.   Unless you are told otherwise, continue any eyedrops you have been prescribed.   Talk to your primary caregiver about all other medicines that you take (both prescription and non-prescription). In some cases, you may need to stop or change medicines near the time of your surgery. This is most important if you are taking blood-thinning medicine.Do not stop medicines unless you are told to do so.   Arrange for someone to drive you to and from the procedure.   Do not put contact lenses in either eye on the day of your surgery.  PROCEDURE There is more than one method for safely removing a cataract. Your doctor can explain the differences and help determine which is best for you. Phacoemulsification surgery is the most common form of cataract surgery.  An injection is given behind the eye or eyedrops are given to make this a painless procedure.   A small cut (incision) is made on the edge of the clear, dome-shaped surface that covers the front of the eye (cornea).   A tiny probe is painlessly inserted into the eye. This device gives off ultrasound waves that soften and break up the cloudy center of the lens. This makes it easier for the cloudy lens to be removed by suction.   An IOL  may be implanted.   The normal lens of the eye is covered by a clear capsule. Part of that capsule is intentionally left in the eye to support the IOL.   Your surgeon may or may not use stitches to close the incision.  There are other forms of cataract surgery that require a larger incision and stiches to close the eye. This approach is taken in cases where the doctor feels that the cataract cannot be easily removed using phacoemulsification. AFTER THE PROCEDURE  When an IOL is implanted, it does not need care. It becomes a permanent part of your eye and cannot  be seen or felt.   Your doctor will schedule follow-up exams to check on your progress.   Review your other medicines with your doctor to see which can be resumed after surgery.   Use eyedrops or take medicine as prescribed by your doctor.  Document Released: 07/21/2011 Document Reviewed: 07/18/2011 [facility] Patient Information 2012 [place], [place].  .Cataract Surgery Care After Refer to this sheet in the next few weeks. These instructions provide you with information on caring for yourself after your procedure. Your caregiver may also give you more specific instructions. Your treatment has been planned according to current medical practices, but problems sometimes occur. Call your caregiver if you have any problems or questions after your procedure.  HOME CARE INSTRUCTIONS   Avoid strenuous activities as directed by your caregiver.   Ask your caregiver when you can resume driving.   Use eyedrops or other medicines to help healing and control pressure inside your eye as directed by your caregiver.   Only take over-the-counter or prescription medicines for pain, discomfort, or fever as directed by your caregiver.   Do not to touch or rub your eyes.   You may be instructed to use a protective shield during the first few days and nights after surgery. If not, wear sunglasses to protect your eyes. This is to protect the eye from pressure or from being accidentally bumped.   Keep the area around your eye clean and dry. Avoid swimming or allowing water to hit you directly in the face while showering. Keep soap and shampoo out of your eyes.   Do not bend or lift heavy objects. Bending increases pressure in the eye. You can walk, climb stairs, and do light household chores.   Do not put a contact lens into the eye that had surgery until your caregiver says it is okay to do so.   Ask your doctor when you can return to work. This will depend on the kind of work that you do. If you work in a  dusty environment, you may be advised to wear protective eyewear for a period of time.   Ask your caregiver when it will be safe to engage in sexual activity.   Continue with your regular eye exams as directed by your caregiver.  What to expect:  It is normal to feel itching and mild discomfort for a few days after cataract surgery. Some fluid discharge is also common, and your eye may be sensitive to light and touch.   After 1 to 2 days, even moderate discomfort should disappear. In most cases, healing will take about 6 weeks.   If you received an intraocular lens (IOL), you may notice that colors are very bright or have a blue tinge. Also, if you have been in bright sunlight, everything may appear reddish for a few hours. If you see these color tinges, it  is because your lens is clear and no longer cloudy. Within a few months after receiving an IOL, these extra colors should go away. When you have healed, you will probably need new glasses.  SEEK MEDICAL CARE IF:   You have increased bruising around your eye.   You have discomfort not helped by medicine.  SEEK IMMEDIATE MEDICAL CARE IF:   You have a fever.   You have a worsening or sudden vision loss.   You have redness, swelling, or increasing pain in the eye.   You have a thick discharge from the eye that had surgery.  MAKE SURE YOU:  Understand these instructions.   Will watch your condition.   Will get help right away if you are not doing well or get worse.  Document Released: 02/18/2005 Document Revised: 07/21/2011 Document Reviewed: 03/25/2011 [facility] Patient Information 2012 [place], [place].    Monitored Anesthesia Care  Monitored anesthesia care is an anesthesia service for a medical procedure. Anesthesia is the loss of the ability to feel pain. It is produced by medications called anesthetics. It may affect a small area of your body (local anesthesia), a large area of your body (regional anesthesia), or your entire  body (general anesthesia). The need for monitored anesthesia care depends your procedure, your condition, and the potential need for regional or general anesthesia. It is often provided during procedures where:   General anesthesia may be needed if there are complications. This is because you need special care when you are under general anesthesia.   You will be under local or regional anesthesia. This is so that you are able to have higher levels of anesthesia if needed.   You will receive calming medications (sedatives). This is especially the case if sedatives are given to put you in a semi-conscious state of relaxation (deep sedation). This is because the amount of sedative needed to produce this state can be hard to predict. Too much of a sedative can produce general anesthesia. Monitored anesthesia care is performed by one or more caregivers who have special training in all types of anesthesia. You will need to meet with these caregivers before your procedure. During this meeting, they will ask you about your medical history. They will also give you instructions to follow. (For example, you will need to stop eating and drinking before your procedure. You may also need to stop or change medications you are taking.) During your procedure, your caregivers will stay with you. They will:   Watch your condition. This includes watching you blood pressure, breathing, and level of pain.   Diagnose and treat problems that occur.   Give medications if they are needed. These may include calming medications (sedatives) and anesthetics.   Make sure you are comfortable.  Having monitored anesthesia care does not necessarily mean that you will be under anesthesia. It does mean that your caregivers will be able to manage anesthesia if you need it or if it occurs. It also means that you will be able to have a different type of anesthesia than you are having if you need it. When your procedure is complete,  your caregivers will continue to watch your condition. They will make sure any medications wear off before you are allowed to go home.  Document Released: 04/27/2005 Document Revised: 11/26/2012 Document Reviewed: 09/12/2012 [facility] Patient Information 2014 [place], [place].

## 2015-01-09 ENCOUNTER — Encounter (HOSPITAL_COMMUNITY)
Admission: RE | Admit: 2015-01-09 | Discharge: 2015-01-09 | Disposition: A | Discharge status: Home / Self Care | Payer: 59 | Source: Ambulatory Visit | Attending: Ophthalmology | Admitting: Ophthalmology

## 2015-01-09 ENCOUNTER — Encounter (HOSPITAL_COMMUNITY): Payer: Self-pay

## 2015-01-09 ENCOUNTER — Other Ambulatory Visit: Payer: Self-pay

## 2015-01-09 DIAGNOSIS — H2512 Age-related nuclear cataract, left eye: Secondary | ICD-10-CM | POA: Diagnosis not present

## 2015-01-09 DIAGNOSIS — Z01818 Encounter for other preprocedural examination: Secondary | ICD-10-CM | POA: Diagnosis not present

## 2015-01-09 LAB — CBC
HCT: 44.3 % (ref 39.0–52.0)
Hemoglobin: 14.9 g/dL (ref 13.0–17.0)
MCH: 29.9 pg (ref 26.0–34.0)
MCHC: 33.6 g/dL (ref 30.0–36.0)
MCV: 88.8 fL (ref 78.0–100.0)
Platelets: 226 10*3/uL (ref 150–400)
RBC: 4.99 MIL/uL (ref 4.22–5.81)
RDW: 13.3 % (ref 11.5–15.5)
WBC: 9.2 10*3/uL (ref 4.0–10.5)

## 2015-01-09 LAB — BASIC METABOLIC PANEL
Anion gap: 10 (ref 5–15)
BUN: 16 mg/dL (ref 6–20)
CO2: 27 mmol/L (ref 22–32)
Calcium: 9.2 mg/dL (ref 8.9–10.3)
Chloride: 101 mmol/L (ref 101–111)
Creatinine, Ser: 1.04 mg/dL (ref 0.61–1.24)
GFR calc Af Amer: >60 mL/min (ref >60)
Glucose, Bld: 125 mg/dL — ABNORMAL HIGH (ref 65–99)
Potassium: 4.2 mmol/L (ref 3.5–5.1)
Sodium: 138 mmol/L (ref 135–145)

## 2015-01-09 NOTE — Pre-Procedure Instructions (Signed)
Patient given information to sign up for my chart. 

## 2015-01-13 ENCOUNTER — Encounter (HOSPITAL_COMMUNITY)
Admission: RE | Disposition: A | Discharge status: Home / Self Care | Payer: Self-pay | Source: Ambulatory Visit | Attending: Ophthalmology

## 2015-01-13 ENCOUNTER — Ambulatory Visit (HOSPITAL_COMMUNITY): Payer: 59 | Admitting: Anesthesiology

## 2015-01-13 ENCOUNTER — Ambulatory Visit (HOSPITAL_COMMUNITY)
Admission: RE | Admit: 2015-01-13 | Discharge: 2015-01-13 | Disposition: A | Discharge status: Home / Self Care | Payer: 59 | Source: Ambulatory Visit | Attending: Ophthalmology | Admitting: Ophthalmology

## 2015-01-13 ENCOUNTER — Encounter (HOSPITAL_COMMUNITY): Payer: Self-pay | Admitting: *Deleted

## 2015-01-13 DIAGNOSIS — K219 Gastro-esophageal reflux disease without esophagitis: Secondary | ICD-10-CM | POA: Diagnosis not present

## 2015-01-13 DIAGNOSIS — H2512 Age-related nuclear cataract, left eye: Secondary | ICD-10-CM | POA: Diagnosis present

## 2015-01-13 DIAGNOSIS — E119 Type 2 diabetes mellitus without complications: Secondary | ICD-10-CM | POA: Insufficient documentation

## 2015-01-13 HISTORY — PX: CATARACT EXTRACTION W/PHACO: SHX586

## 2015-01-13 SURGERY — PHACOEMULSIFICATION, CATARACT, WITH IOL INSERTION
Anesthesia: Monitor Anesthesia Care | Site: Eye | Laterality: Left

## 2015-01-13 MED ORDER — BSS IO SOLN
INTRAOCULAR | Status: DC | PRN
  Administered 2015-01-13: 15 mL

## 2015-01-13 MED ORDER — MIDAZOLAM HCL 2 MG/2ML IJ SOLN
INTRAMUSCULAR | Status: AC
Start: 2015-01-13 — End: 2015-01-13
  Filled 2015-01-13: qty 2

## 2015-01-13 MED ORDER — PHENYLEPHRINE HCL 2.5 % OP SOLN
1.0000 [drp] | OPHTHALMIC | Status: AC
  Administered 2015-01-13 (×3): 1 [drp] via OPHTHALMIC

## 2015-01-13 MED ORDER — FENTANYL CITRATE (PF) 100 MCG/2ML IJ SOLN
25.0000 ug | INTRAMUSCULAR | Status: AC
  Administered 2015-01-13 (×2): 25 ug via INTRAVENOUS

## 2015-01-13 MED ORDER — MIDAZOLAM HCL 2 MG/2ML IJ SOLN
1.0000 mg | INTRAMUSCULAR | Status: DC | PRN
  Administered 2015-01-13: 2 mg via INTRAVENOUS

## 2015-01-13 MED ORDER — LACTATED RINGERS IV SOLN
INTRAVENOUS | Status: DC
  Administered 2015-01-13: 1000 mL via INTRAVENOUS

## 2015-01-13 MED ORDER — CYCLOPENTOLATE-PHENYLEPHRINE 0.2-1 % OP SOLN
1.0000 [drp] | OPHTHALMIC | Status: AC
  Administered 2015-01-13 (×3): 1 [drp] via OPHTHALMIC

## 2015-01-13 MED ORDER — KETOROLAC TROMETHAMINE 0.5 % OP SOLN
1.0000 [drp] | OPHTHALMIC | Status: AC
  Administered 2015-01-13 (×3): 1 [drp] via OPHTHALMIC

## 2015-01-13 MED ORDER — TETRACAINE HCL 0.5 % OP SOLN
1.0000 [drp] | OPHTHALMIC | Status: AC
  Administered 2015-01-13 (×3): 1 [drp] via OPHTHALMIC

## 2015-01-13 MED ORDER — FENTANYL CITRATE (PF) 100 MCG/2ML IJ SOLN
INTRAMUSCULAR | Status: AC
  Filled 2015-01-13: qty 2

## 2015-01-13 MED ORDER — TETRACAINE 0.5 % OP SOLN OPTIME - NO CHARGE
OPHTHALMIC | Status: DC | PRN
  Administered 2015-01-13: 1 [drp] via OPHTHALMIC

## 2015-01-13 MED ORDER — PROVISC 10 MG/ML IO SOLN
INTRAOCULAR | Status: DC | PRN
  Administered 2015-01-13: 0.85 mL via INTRAOCULAR

## 2015-01-13 MED ORDER — EPINEPHRINE HCL 1 MG/ML IJ SOLN
INTRAOCULAR | Status: DC | PRN
  Administered 2015-01-13: 500 mL

## 2015-01-13 MED ORDER — EPINEPHRINE HCL 1 MG/ML IJ SOLN
INTRAMUSCULAR | Status: AC
  Filled 2015-01-13: qty 1

## 2015-01-13 SURGICAL SUPPLY — 9 items
CLOTH BEACON ORANGE TIMEOUT ST (SAFETY) ×1 IMPLANT
EYE SHIELD UNIVERSAL CLEAR (GAUZE/BANDAGES/DRESSINGS) ×1 IMPLANT
GLOVE BIO SURGEON STRL SZ 6.5 (GLOVE) ×1 IMPLANT
GLOVE EXAM NITRILE MD LF STRL (GLOVE) ×1 IMPLANT
LENS ALC ACRYL/TECN (Ophthalmic Related) ×2 IMPLANT
PAD ARMBOARD 7.5X6 YLW CONV (MISCELLANEOUS) ×1 IMPLANT
TAPE SURG TRANSPORE 1 IN (GAUZE/BANDAGES/DRESSINGS) IMPLANT
TAPE SURGICAL TRANSPORE 1 IN (GAUZE/BANDAGES/DRESSINGS) ×1
WATER STERILE IRR 250ML POUR (IV SOLUTION) ×1 IMPLANT

## 2015-01-13 NOTE — H&P (Signed)
The patient was re examined and there is no change in the patients condition since the original H and P. 

## 2015-01-13 NOTE — Op Note (Signed)
Patient brought to the operating room and prepped and draped in the usual manner.  Lid speculum inserted in left eye.  Stab incision made at the twelve o'clock position.  Provisc instilled in the anterior chamber.   A 2.4 mm. Stab incision was made temporally.  An anterior capsulotomy was done with a bent 25 gauge needle.  The nucleus was hydrodissected.  The Phaco tip was inserted in the anterior chamber and the nucleus was emulsified.  CDE was 3.08.  The cortical material was then removed with the I and A tip.  Posterior capsule was the polished.  The anterior chamber was deepened with Provisc.  A 20.5 Diopter Alcon [id number] IOL was then inserted in the capsular bag.  Provisc was then removed with the I and A tip.  The wound was then hydrated.  Patient sent to the Recovery Room in good condition with follow up in my office.  Preoperative Diagnosis:  Nuclear Cataract OS Postoperative Diagnosis:  Same Procedure name: Kelman Phacoemulsification OS with IOL

## 2015-01-13 NOTE — Anesthesia Preprocedure Evaluation (Signed)
Anesthesia Evaluation  Patient identified by MRN, date of birth, ID band Patient awake    Reviewed: Allergy & Precautions, NPO status , Patient's Chart, lab work & pertinent test results  History of Anesthesia Complications Negative for: history of anesthetic complications  Airway Mallampati: II  TM Distance: >3 FB     Dental  (+) Teeth Intact   Pulmonary neg pulmonary ROS,  breath sounds clear to auscultation        Cardiovascular negative cardio ROS  Rhythm:Regular Rate:Normal     Neuro/Psych    GI/Hepatic GERD-  Medicated and Controlled,  Endo/Other  diabetes  Renal/GU      Musculoskeletal   Abdominal   Peds  Hematology   Anesthesia Other Findings   Reproductive/Obstetrics                             Anesthesia Physical Anesthesia Plan  ASA: II  Anesthesia Plan: MAC   Post-op Pain Management:    Induction: Intravenous  Airway Management Planned: Nasal Cannula  Additional Equipment:   Intra-op Plan:   Post-operative Plan:   Informed Consent: I have reviewed the patients History and Physical, chart, labs and discussed the procedure including the risks, benefits and alternatives for the proposed anesthesia with the patient or authorized representative who has indicated his/her understanding and acceptance.     Plan Discussed with:   Anesthesia Plan Comments:         Anesthesia Quick Evaluation

## 2015-01-13 NOTE — Anesthesia Postprocedure Evaluation (Signed)
  Anesthesia Post-op Note  Patient: Jesus Ingram  Procedure(s) Performed: Procedure(s) with comments: CATARACT EXTRACTION PHACO AND INTRAOCULAR LENS PLACEMENT (IOC) (Left) - CDE:3.08  Patient Location: Short Stay  Anesthesia Type:MAC  Level of Consciousness: awake, alert , oriented and patient cooperative  Airway and Oxygen Therapy: Patient Spontanous Breathing  Post-op Pain: none  Post-op Assessment: Post-op Vital signs reviewed, Patient's Cardiovascular Status Stable, Respiratory Function Stable, Patent Airway, No signs of Nausea or vomiting and Pain level controlled  Post-op Vital Signs: Reviewed and stable  Last Vitals:  Filed Vitals:   01/13/15 1005  BP: 101/66  Resp: 15    Complications: No apparent anesthesia complications

## 2015-01-13 NOTE — Transfer of Care (Signed)
Immediate Anesthesia Transfer of Care Note  Patient: Jesus Ingram  Procedure(s) Performed: Procedure(s) with comments: CATARACT EXTRACTION PHACO AND INTRAOCULAR LENS PLACEMENT (IOC) (Left) - CDE:3.08  Patient Location: Short Stay  Anesthesia Type:MAC  Level of Consciousness: awake, alert , oriented and patient cooperative  Airway & Oxygen Therapy: Patient Spontanous Breathing  Post-op Assessment: Report given to RN and Post -op Vital signs reviewed and stable  Post vital signs: Reviewed and stable  Last Vitals:  Filed Vitals:   01/13/15 1005  BP: 101/66  Resp: 15    Complications: No apparent anesthesia complications

## 2015-01-13 NOTE — Anesthesia Procedure Notes (Signed)
Procedure Name: MAC Date/Time: 01/13/2015 10:11 AM Performed by: Pernell DupreADAMS, AMY A Pre-anesthesia Checklist: Patient identified, Timeout performed, Emergency Drugs available, Suction available and Patient being monitored Oxygen Delivery Method: Nasal cannula

## 2015-01-13 NOTE — Discharge Instructions (Signed)
Jesus Ingram  01/13/2015           [facility] Instructions [address]- [place] [id number] [address]      1. Avoid closing eyes tightly. One often closes the eye tightly when laughing, talking, sneezing, coughing or if they feel irritated. At these times, you should be careful not to close your eyes tightly.  2. Instill eye drops as instructed. To instill drops in your eye, open it, look up and have someone gently pull the lower lid down and instill a couple of drops inside the lower lid.  3. Do not touch upper lid.  4. Take Advil or Tylenol for pain.  5. You may use either eye for near work, such as reading or sewing and you may watch television.  6. You may have your hair done at the beauty parlor at any time.  7. Wear dark glasses with or without your own glasses if you are in bright light.  8. Call our office at [phone] or [phone] if you have sharp pain in your eye or unusual symptoms.  9. Do not be concerned because vision in the operative eye is not good. It will not be good, no matter how successful the operation, until you get a special lens for it. Your old glasses will not be suited to the new eye that was operated on and you will not be ready for a new lens for about a month.  10. [facility].    I have received a copy of the above instructions and will follow them.     Follow up today with Dr. Nile RiggsShapiro in his office between 2-3 pm

## 2015-01-14 ENCOUNTER — Encounter (HOSPITAL_COMMUNITY): Payer: Self-pay | Admitting: Ophthalmology

## 2015-04-06 LAB — HEMOGLOBIN A1C: Hemoglobin A1C: 6.6

## 2015-08-04 LAB — BASIC METABOLIC PANEL
BUN: 13 (ref 4–21)
CO2: 27 — AB (ref 13–22)
Creatinine: 1.2 (ref <=1.3)
Glucose: 127
Potassium: 4.5 (ref 3.4–5.3)
Sodium: 138 (ref 137–147)

## 2015-08-04 LAB — COMPREHENSIVE METABOLIC PANEL: Calcium: 9.5 (ref 8.7–10.7)

## 2015-08-04 LAB — HEMOGLOBIN A1C: Hemoglobin A1C: 7.2

## 2015-10-27 LAB — HEMOGLOBIN A1C: Hemoglobin A1C: 6.4

## 2015-12-03 ENCOUNTER — Other Ambulatory Visit (HOSPITAL_COMMUNITY): Payer: Self-pay | Admitting: Pulmonary Disease

## 2015-12-03 ENCOUNTER — Ambulatory Visit (HOSPITAL_COMMUNITY)
Admission: RE | Admit: 2015-12-03 | Discharge: 2015-12-03 | Disposition: A | Discharge status: Home / Self Care | Payer: 59 | Source: Ambulatory Visit | Attending: Pulmonary Disease | Admitting: Pulmonary Disease

## 2015-12-03 DIAGNOSIS — S90852A Superficial foreign body, left foot, initial encounter: Secondary | ICD-10-CM | POA: Insufficient documentation

## 2015-12-03 DIAGNOSIS — X58XXXA Exposure to other specified factors, initial encounter: Secondary | ICD-10-CM | POA: Insufficient documentation

## 2015-12-03 DIAGNOSIS — M79672 Pain in left foot: Secondary | ICD-10-CM

## 2016-02-01 LAB — HEMOGLOBIN A1C: Hemoglobin A1C: 6.5

## 2016-03-15 ENCOUNTER — Ambulatory Visit (INDEPENDENT_AMBULATORY_CARE_PROVIDER_SITE_OTHER): Payer: 59 | Admitting: Orthopaedic Surgery

## 2016-03-15 ENCOUNTER — Ambulatory Visit (HOSPITAL_COMMUNITY)
Admission: RE | Admit: 2016-03-15 | Discharge: 2016-03-15 | Disposition: A | Discharge status: Home / Self Care | Payer: 59 | Source: Ambulatory Visit | Attending: Orthopaedic Surgery | Admitting: Orthopaedic Surgery

## 2016-03-15 ENCOUNTER — Encounter: Payer: Self-pay | Admitting: Orthopaedic Surgery

## 2016-03-15 VITALS — BP 121/79 | HR 76 | Temp 97.3°F | Ht 70.0 in | Wt 237.4 lb

## 2016-03-15 DIAGNOSIS — M25511 Pain in right shoulder: Secondary | ICD-10-CM

## 2016-03-15 NOTE — Progress Notes (Signed)
Subjective:  My right shoulder hurts    Patient ID: Jesus Ingram, male    DOB: Jul 03, 1961, 55 y.o.   MRN: [id number]  HPI He has had right shoulder pain for about five months or so.  He has pain that varies.  Some days he has pain with motion and other days it does not hurt.  He has had some shooting pain to the elbow but not below on the right.  He has no swelling, no redness, no trauma.  He finds that putting his hand behind his head helps the most to relieve the pain.  He is on Relafen 750 for his back pain and the Relafen does not help the shoulder.  He has back pain every so often and the Relafen helps.  He does not like taking medicine and only takes the Relafen when the back bothers him.  He has diabetes diet controlled.  He has restless leg syndrome.  He is not sleeping well because of the shoulder pain.  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  He uses his arm in all sorts of positions at work and can do the job well.  Again his pain is intermittent.    Review of Systems  HENT: Negative for congestion.   Respiratory: Negative for cough and shortness of breath.   Cardiovascular: Negative for chest pain and leg swelling.  Endocrine: Negative for cold intolerance.  Musculoskeletal: Positive for arthralgias and back pain.  Allergic/Immunologic: Negative for environmental allergies.   Past Medical History:  Diagnosis Date  . Borderline diabetes   . Chronic back pain   . GERD (gastroesophageal reflux disease)   . History of renal calculi   . Reflux    occasional, uses TUMS  . Ruptured disc, thoracic   . Seasonal allergies     Past Surgical History:  Procedure Laterality Date  . CATARACT EXTRACTION W/PHACO  01/10/2012   Procedure: CATARACT EXTRACTION PHACO AND INTRAOCULAR LENS PLACEMENT (IOC);  Surgeon: Loraine Leriche T. Nile Riggs, MD;  Location: [facility];  Service: Ophthalmology;  Laterality: Right;  CDE 9.77  . CATARACT  EXTRACTION W/PHACO Left 01/13/2015   Procedure: CATARACT EXTRACTION PHACO AND INTRAOCULAR LENS PLACEMENT (IOC);  Surgeon: Jethro Bolus, MD;  Location: [facility];  Service: Ophthalmology;  Laterality: Left;  CDE:3.08  . COLONOSCOPY N/A 04/25/2013   Procedure: COLONOSCOPY;  Surgeon: Malissa Hippo, MD;  Location: [facility];  Service: Endoscopy;  Laterality: N/A;  830  . CYSTOSCOPY/RETROGRADE/URETEROSCOPY/STONE EXTRACTION WITH BASKET  2011   with stents, [facility]; [facility]  . LITHOTRIPSY    . WISDOM TOOTH EXTRACTION      Current Outpatient Prescriptions on File Prior to Visit  Medication Sig Dispense Refill  . acetaminophen (TYLENOL) 500 MG tablet Take 500 mg by mouth every 6 (six) hours as needed. For pain    . cyclobenzaprine (FLEXERIL) 10 MG tablet Take 1 tablet (10 mg total) by mouth 3 (three) times daily as needed for muscle spasms. (Patient not taking: Reported on 03/15/2016) 20 tablet 0  . Melatonin 3 MG TABS Take 1 tablet by mouth once a week. Only takes rarely as needed For sleep    . naproxen sodium (ALEVE) 220 MG tablet Take 220 mg by mouth once as needed (for pain).    Marland Kitchen oxyCODONE-acetaminophen (PERCOCET/ROXICET) 5-325 MG per tablet Take 1 tablet by mouth every 6 (six) hours as needed for pain. (Patient not taking: Reported on 03/15/2016) 20  tablet 0   No current facility-administered medications on file prior to visit.     Social History   Social History  . Marital status: Married    Spouse name: N/A  . Number of children: N/A  . Years of education: N/A   Occupational History  . Not on file.   Social History Main Topics  . Smoking status: Never Smoker  . Smokeless tobacco: Current User    Types: Chew  . Alcohol use No  . Drug use: No  . Sexual activity: Yes    Birth control/ protection: None   Other Topics Concern  . Not on file   Social History Narrative  . No narrative on file    Family History  Problem Relation Age of Onset  . Colon cancer Neg Hx     BP 121/79  (BP Location: Right Arm, Patient Position: Sitting, Cuff Size: Large)   Pulse 76   Temp 97.3 F (36.3 C)   Ht 5\' 10"  (1.778 m)   Wt 237 lb 6.4 oz (107.7 kg)   BMI 34.06 kg/m       Objective:   Physical Exam  Constitutional: He is oriented to person, place, and time. He appears well-developed and well-nourished.  HENT:  Head: Normocephalic and atraumatic.  Eyes: Conjunctivae and EOM are normal. Pupils are equal, round, and reactive to light.  Neck: Normal range of motion. Neck supple.  Cardiovascular: Normal rate, regular rhythm and intact distal pulses.   Pulmonary/Chest: Effort normal.  Abdominal: Soft.  Musculoskeletal: He exhibits tenderness (Right shoulder is tender but has full motion, no effusion, normal grips, NV intact.  Neck with full ROM.  Left shoulder normal.).  Neurological: He is alert and oriented to person, place, and time. He has normal reflexes. No cranial nerve deficit. He exhibits normal muscle tone. Coordination normal.  Skin: Skin is warm and dry.  Psychiatric: He has a normal mood and affect. His behavior is normal. Judgment and thought content normal.    X-rays were done at the hospital and reported separately.       Assessment & Plan:   Encounter Diagnosis  Name Primary?  . Right shoulder pain Yes   I will have him take one Aleve prior to bedtime every night for the next two weeks.  I will inject the right shoulder today.  He may need MRI.  He understands.  PROCEDURE NOTE:  The patient request injection, verbal consent was obtained.  The right shoulder was prepped appropriately after time out was performed.   Sterile technique was observed and injection of 1 cc of Depo-Medrol 40 mg with several cc's of plain xylocaine. Anesthesia was provided by ethyl chloride and a 20-gauge needle was used to inject the shoulder area. A posterior approach was used.  The injection was tolerated well.  A band aid dressing was applied.  The patient was advised  to apply ice later today and tomorrow to the injection sight as needed.  Electronically Signed Darreld Mclean, MD 8/1/20179:23 AM

## 2016-03-29 ENCOUNTER — Ambulatory Visit (INDEPENDENT_AMBULATORY_CARE_PROVIDER_SITE_OTHER): Payer: 59 | Admitting: Orthopaedic Surgery

## 2016-03-29 ENCOUNTER — Encounter: Payer: Self-pay | Admitting: Orthopaedic Surgery

## 2016-03-29 VITALS — BP 122/77 | HR 94 | Temp 98.1°F | Ht 70.0 in | Wt 237.0 lb

## 2016-03-29 DIAGNOSIS — M25511 Pain in right shoulder: Secondary | ICD-10-CM

## 2016-03-29 NOTE — Progress Notes (Signed)
Patient ZO:XWRUEAV:Jesus Ingram, male DOB:1961/03/08, 55 y.o. WUJ:[id number]  Chief Complaint  Patient presents with  . Follow-up    right shoulder    HPI  Gwendolyn FillJeffrey N Mcguirk is a 55 y.o. male who has right shoulder pain.  He is improved after the injection and taking Aleve at night.  He rode a four wheeler yesterday and tolerated it well.  He is better but still has some pain of the right shoulder.  I told him he has to wait six weeks before MRI can be done according to his insurance.  I will see him in a month.  Continue present course. If worse he will let me know.  If pain continues, then get MRI after next visit.  He is agreeable to this. HPI  Body mass index is 34.01 kg/m.  ROS  Review of Systems  HENT: Negative for congestion.   Respiratory: Negative for cough and shortness of breath.   Cardiovascular: Negative for chest pain and leg swelling.  Endocrine: Negative for cold intolerance.  Musculoskeletal: Positive for arthralgias and back pain.  Allergic/Immunologic: Negative for environmental allergies.    Past Medical History:  Diagnosis Date  . Borderline diabetes   . Chronic back pain   . GERD (gastroesophageal reflux disease)   . History of renal calculi   . Reflux    occasional, uses TUMS  . Ruptured disc, thoracic   . Seasonal allergies     Past Surgical History:  Procedure Laterality Date  . CATARACT EXTRACTION W/PHACO  01/10/2012   Procedure: CATARACT EXTRACTION PHACO AND INTRAOCULAR LENS PLACEMENT (IOC);  Surgeon: Loraine LericheMark T. Nile RiggsShapiro, MD;  Location: [facility];  Service: Ophthalmology;  Laterality: Right;  CDE 9.77  . CATARACT EXTRACTION W/PHACO Left 01/13/2015   Procedure: CATARACT EXTRACTION PHACO AND INTRAOCULAR LENS PLACEMENT (IOC);  Surgeon: Jethro BolusMark Shapiro, MD;  Location: [facility];  Service: Ophthalmology;  Laterality: Left;  CDE:3.08  . COLONOSCOPY N/A 04/25/2013   Procedure: COLONOSCOPY;  Surgeon: Malissa HippoNajeeb U Rehman, MD;  Location: [facility];  Service: Endoscopy;   Laterality: N/A;  830  . CYSTOSCOPY/RETROGRADE/URETEROSCOPY/STONE EXTRACTION WITH BASKET  2011   with stents, [facility]; [facility]  . LITHOTRIPSY    . WISDOM TOOTH EXTRACTION      Family History  Problem Relation Age of Onset  . Colon cancer Neg Hx     Social History Social History  Substance Use Topics  . Smoking status: Never Smoker  . Smokeless tobacco: Current User    Types: Chew  . Alcohol use No    No Known Allergies  Current Outpatient Prescriptions  Medication Sig Dispense Refill  . acetaminophen (TYLENOL) 500 MG tablet Take 500 mg by mouth every 6 (six) hours as needed. For pain    . cyclobenzaprine (FLEXERIL) 10 MG tablet Take 1 tablet (10 mg total) by mouth 3 (three) times daily as needed for muscle spasms. 20 tablet 0  . Melatonin 3 MG TABS Take 1 tablet by mouth once a week. Only takes rarely as needed For sleep    . metFORMIN (GLUCOPHAGE) 500 MG tablet Take 500 mg by mouth 2 (two) times daily.  3  . nabumetone (RELAFEN) 750 MG tablet Take 750 mg by mouth daily.    . naproxen sodium (ALEVE) 220 MG tablet Take 220 mg by mouth once as needed (for pain).    Marland Kitchen. rOPINIRole (REQUIP) 0.5 MG tablet Take 0.5 mg by mouth 3 (three) times daily.    Marland Kitchen. oxyCODONE-acetaminophen (PERCOCET/ROXICET) 5-325 MG per tablet Take  1 tablet by mouth every 6 (six) hours as needed for pain. (Patient not taking: Reported on 03/29/2016) 20 tablet 0   No current facility-administered medications for this visit.      Physical Exam  Blood pressure 122/77, pulse 94, temperature 98.1 F (36.7 C), height 5\' 10"  (1.778 m), weight 237 lb (107.5 kg).  Constitutional: overall normal hygiene, normal nutrition, well developed, normal grooming, normal body habitus. Assistive device:none  Musculoskeletal: gait and station Limp none, muscle tone and strength are normal, no tremors or atrophy is present.  .  Neurological: coordination overall normal.  Deep tendon reflex/nerve stretch intact.  Sensation normal.   Cranial nerves II-XII intact.   Skin:   normal overall no scars, lesions, ulcers or rashes. No psoriasis.  Psychiatric: Alert and oriented x 3.  Recent memory intact, remote memory unclear.  Normal mood and affect. Well groomed.  Good eye contact.  Cardiovascular: overall no swelling, no varicosities, no edema bilaterally, normal temperatures of the legs and arms, no clubbing, cyanosis and good capillary refill.  Lymphatic: palpation is normal.  His right shoulder has full motion and only slight pain with resisted abduction.  NV is intact.   The patient has been educated about the nature of the problem(s) and counseled on treatment options.  The patient appeared to understand what I have discussed and is in agreement with it.  Encounter Diagnosis  Name Primary?  . Right shoulder pain Yes    PLAN Call if any problems.  Precautions discussed.  Continue current medications.   Return to clinic 1 month   Consider MRI if not improved.  Electronically Signed Darreld McleanWayne Ercia Crisafulli, MD 8/15/20172:51 PM

## 2016-04-26 LAB — HEMOGLOBIN A1C: Hemoglobin A1C: 6.3

## 2016-04-28 ENCOUNTER — Ambulatory Visit (INDEPENDENT_AMBULATORY_CARE_PROVIDER_SITE_OTHER): Payer: 59 | Admitting: Orthopaedic Surgery

## 2016-04-28 ENCOUNTER — Encounter: Payer: Self-pay | Admitting: Orthopaedic Surgery

## 2016-04-28 VITALS — BP 130/88 | HR 96 | Temp 97.9°F | Resp 18 | Ht 70.0 in | Wt 230.0 lb

## 2016-04-28 DIAGNOSIS — M25511 Pain in right shoulder: Secondary | ICD-10-CM

## 2016-04-28 NOTE — Patient Instructions (Signed)
Shoulder Range of Motion Exercises Shoulder range of motion (ROM) exercises are designed to keep the shoulder moving freely. They are often recommended for people who have shoulder pain. MOVEMENT EXERCISE When you are able, do this exercise 5-6 days per week, or as told by your health care provider. Work toward doing 2 sets of 10 swings. Pendulum Exercise How To Do This Exercise Lying Down 1. Lie face-down on a bed with your abdomen close to the side of the bed. 2. Let your arm hang over the side of the bed. 3. Relax your shoulder, arm, and hand. 4. Slowly and gently swing your arm forward and back. Do not use your neck muscles to swing your arm. They should be relaxed. If you are struggling to swing your arm, have someone gently swing it for you. When you do this exercise for the first time, swing your arm at a 15 degree angle for 15 seconds, or swing your arm 10 times. As pain lessens over time, increase the angle of the swing to 30-45 degrees. 5. Repeat steps 1-4 with the other arm. How To Do This Exercise While Standing 1. Stand next to a sturdy chair or table and hold on to it with your hand.  Bend forward at the waist.  Bend your knees slightly.  Relax your other arm and let it hang limp.  Relax the shoulder blade of the arm that is hanging and let it drop.  While keeping your shoulder relaxed, use body motion to swing your arm in small circles. The first time you do this exercise, swing your arm for about 30 seconds or 10 times. When you do it next time, swing your arm for a little longer.  Stand up tall and relax.  Repeat steps 1-7, this time changing the direction of the circles. 2. Repeat steps 1-8 with the other arm. STRETCHING EXERCISES Do these exercises 3-4 times per day on 5-6 days per week or as told by your health care provider. Work toward holding the stretch for 20 seconds. Stretching Exercise 1 1. Lift your arm straight out in front of you. 2. Bend your arm 90  degrees at the elbow (right angle) so your forearm goes across your body and looks like the letter "L." 3. Use your other arm to gently pull the elbow forward and across your body. 4. Repeat steps 1-3 with the other arm. Stretching Exercise 2 You will need a towel or rope for this exercise. 1. Bend one arm behind your back with the palm facing outward. 2. Hold a towel with your other hand. 3. Reach the arm that holds the towel above your head, and bend that arm at the elbow. Your wrist should be behind your neck. 4. Use your free hand to grab the free end of the towel. 5. With the higher hand, gently pull the towel up behind you. 6. With the lower hand, pull the towel down behind you. 7. Repeat steps 1-6 with the other arm. STRENGTHENING EXERCISES Do each of these exercises at four different times of day (sessions) every day or as told by your health care provider. To begin with, repeat each exercise 5 times (repetitions). Work toward doing 3 sets of 12 repetitions or as told by your health care provider. Strengthening Exercise 1 You will need a light weight for this activity. As you grow stronger, you may use a heavier weight. 1. Standing with a weight in your hand, lift your arm straight out to the side   until it is at the same height as your shoulder. 2. Bend your arm at 90 degrees so that your fingers are pointing to the ceiling. 3. Slowly raise your hand until your arm is straight up in the air. 4. Repeat steps 1-3 with the other arm. Strengthening Exercise 2 You will need a light weight for this activity. As you grow stronger, you may use a heavier weight. 1. Standing with a weight in your hand, gradually move your straight arm in an arc, starting at your side, then out in front of you, then straight up over your head. 2. Gradually move your other arm in an arc, starting at your side, then out in front of you, then straight up over your head. 3. Repeat steps 1-2 with the other  arm. Strengthening Exercise 3 You will need an elastic band for this activity. As you grow stronger, gradually increase the size of the bands or increase the number of bands that you use at one time. 1. While standing, hold an elastic band in one hand and raise that arm up in the air. 2. With your other hand, pull down the band until that hand is by your side. 3. Repeat steps 1-2 with the other arm.   This information is not intended to replace advice given to you by your health care provider. Make sure you discuss any questions you have with your health care provider.   Document Released: 04/30/2003 Document Revised: 12/16/2014 Document Reviewed: 07/28/2014 Elsevier Interactive Patient Education 2016 [employer]  

## 2016-04-28 NOTE — Progress Notes (Signed)
Patient ZO:XWRUEAV:Jesus Ingram, male DOB:12-Jul-1961, 55 y.o. WUJ:[id number]  Chief Complaint  Patient presents with  . Follow-up    4 week recheck on right shoulder.    HPI  Jesus Ingram is a 55 y.o. male who has had right shoulder pain.  He is better.  He has no swelling or redness or paresthesias.  He has some pain at times.  He has no new trauma.  He wants to hold off on any MRI for now as he is improved.  I have printed out a new exercise sheet for him. HPI  Body mass index is 33 kg/m.  ROS  Review of Systems  HENT: Negative for congestion.   Respiratory: Negative for cough and shortness of breath.   Cardiovascular: Negative for chest pain and leg swelling.  Endocrine: Negative for cold intolerance.  Musculoskeletal: Positive for arthralgias and back pain.  Allergic/Immunologic: Negative for environmental allergies.    Past Medical History:  Diagnosis Date  . Borderline diabetes   . Chronic back pain   . GERD (gastroesophageal reflux disease)   . History of renal calculi   . Reflux    occasional, uses TUMS  . Ruptured disc, thoracic   . Seasonal allergies     Past Surgical History:  Procedure Laterality Date  . CATARACT EXTRACTION W/PHACO  01/10/2012   Procedure: CATARACT EXTRACTION PHACO AND INTRAOCULAR LENS PLACEMENT (IOC);  Surgeon: Loraine LericheMark T. Nile RiggsShapiro, MD;  Location: [facility];  Service: Ophthalmology;  Laterality: Right;  CDE 9.77  . CATARACT EXTRACTION W/PHACO Left 01/13/2015   Procedure: CATARACT EXTRACTION PHACO AND INTRAOCULAR LENS PLACEMENT (IOC);  Surgeon: Jethro BolusMark Shapiro, MD;  Location: [facility];  Service: Ophthalmology;  Laterality: Left;  CDE:3.08  . COLONOSCOPY N/A 04/25/2013   Procedure: COLONOSCOPY;  Surgeon: Malissa HippoNajeeb U Rehman, MD;  Location: [facility];  Service: Endoscopy;  Laterality: N/A;  830  . CYSTOSCOPY/RETROGRADE/URETEROSCOPY/STONE EXTRACTION WITH BASKET  2011   with stents, [facility]; [facility]  . LITHOTRIPSY    . WISDOM TOOTH EXTRACTION      Family  History  Problem Relation Age of Onset  . Colon cancer Neg Hx     Social History Social History  Substance Use Topics  . Smoking status: Never Smoker  . Smokeless tobacco: Current User    Types: Chew  . Alcohol use No    No Known Allergies  Current Outpatient Prescriptions  Medication Sig Dispense Refill  . acetaminophen (TYLENOL) 500 MG tablet Take 500 mg by mouth every 6 (six) hours as needed. For pain    . cyclobenzaprine (FLEXERIL) 10 MG tablet Take 1 tablet (10 mg total) by mouth 3 (three) times daily as needed for muscle spasms. 20 tablet 0  . Melatonin 3 MG TABS Take 1 tablet by mouth once a week. Only takes rarely as needed For sleep    . metFORMIN (GLUCOPHAGE) 500 MG tablet Take 500 mg by mouth 2 (two) times daily.  3  . nabumetone (RELAFEN) 750 MG tablet Take 750 mg by mouth daily.    . naproxen sodium (ALEVE) 220 MG tablet Take 220 mg by mouth once as needed (for pain).    Marland Kitchen. oxyCODONE-acetaminophen (PERCOCET/ROXICET) 5-325 MG per tablet Take 1 tablet by mouth every 6 (six) hours as needed for pain. (Patient not taking: Reported on 03/29/2016) 20 tablet 0  . rOPINIRole (REQUIP) 0.5 MG tablet Take 0.5 mg by mouth 3 (three) times daily.     No current facility-administered medications for this visit.  Physical Exam  Blood pressure 130/88, pulse 96, temperature 97.9 F (36.6 C), resp. rate 18, height 5\' 10"  (1.778 m), weight 230 lb (104.3 kg).  Constitutional: overall normal hygiene, normal nutrition, well developed, normal grooming, normal body habitus. Assistive device:none  Musculoskeletal: gait and station Limp none, muscle tone and strength are normal, no tremors or atrophy is present.  .  Neurological: coordination overall normal.  Deep tendon reflex/nerve stretch intact.  Sensation normal.  Cranial nerves II-XII intact.   Skin:   Normal overall no scars, lesions, ulcers or rashes. No psoriasis.  Psychiatric: Alert and oriented x 3.  Recent memory intact,  remote memory unclear.  Normal mood and affect. Well groomed.  Good eye contact.  Cardiovascular: overall no swelling, no varicosities, no edema bilaterally, normal temperatures of the legs and arms, no clubbing, cyanosis and good capillary refill.  Lymphatic: palpation is normal.  He has full motion of both shoulders with some tenderness of the right in the extremes and with resistance to abduction.  NV intact.  He has no swelling, no numbness and normal grips.  The patient has been educated about the nature of the problem(s) and counseled on treatment options.  The patient appeared to understand what I have discussed and is in agreement with it.  Encounter Diagnosis  Name Primary?  . Right shoulder pain Yes    PLAN Call if any problems.  Precautions discussed.  Continue current medications.   Return to clinic prn   Electronically Signed Darreld Mclean, MD 9/14/20173:06 PM

## 2016-06-07 ENCOUNTER — Encounter: Payer: Self-pay | Admitting: Radiology

## 2016-06-07 ENCOUNTER — Encounter: Payer: Self-pay | Admitting: Orthopaedic Surgery

## 2016-06-07 ENCOUNTER — Ambulatory Visit (INDEPENDENT_AMBULATORY_CARE_PROVIDER_SITE_OTHER): Payer: 59 | Admitting: Orthopaedic Surgery

## 2016-06-07 VITALS — BP 120/77 | HR 78 | Temp 97.7°F | Ht 70.0 in | Wt 238.0 lb

## 2016-06-07 DIAGNOSIS — G5621 Lesion of ulnar nerve, right upper limb: Secondary | ICD-10-CM | POA: Diagnosis not present

## 2016-06-07 NOTE — Progress Notes (Signed)
Patient [name], male DOB:23-Feb-1961, 55 y.o. VOZ:[id number]  Chief Complaint  Patient presents with  . new problem    Right hand numbness    HPI  Jesus Ingram is a 55 y.o. male who has developed numbness in the right little finger and part of the ring finger and pain running from the medial elbow at times.  He has no trauma, no swelling, no redness.  It has gotten worse.  It has been present about three weeks.  He has history of neck problems and pain in the past and has seen neurosurgeon for this but it has been over five years.  He has no pain to any other finger.  He has no other joint problem.  HPI  Body mass index is 34.15 kg/m.  ROS  Review of Systems  HENT: Negative for congestion.   Respiratory: Negative for cough and shortness of breath.   Cardiovascular: Negative for chest pain and leg swelling.  Endocrine: Negative for cold intolerance.  Musculoskeletal: Positive for arthralgias and back pain.  Allergic/Immunologic: Negative for environmental allergies.    Past Medical History:  Diagnosis Date  . Borderline diabetes   . Chronic back pain   . GERD (gastroesophageal reflux disease)   . History of renal calculi   . Reflux    occasional, uses TUMS  . Ruptured disc, thoracic   . Seasonal allergies     Past Surgical History:  Procedure Laterality Date  . CATARACT EXTRACTION W/PHACO  01/10/2012   Procedure: CATARACT EXTRACTION PHACO AND INTRAOCULAR LENS PLACEMENT (IOC);  Surgeon: Loraine LericheMark T. Nile RiggsShapiro, MD;  Location: [facility];  Service: Ophthalmology;  Laterality: Right;  CDE 9.77  . CATARACT EXTRACTION W/PHACO Left 01/13/2015   Procedure: CATARACT EXTRACTION PHACO AND INTRAOCULAR LENS PLACEMENT (IOC);  Surgeon: Jethro BolusMark Shapiro, MD;  Location: [facility];  Service: Ophthalmology;  Laterality: Left;  CDE:3.08  . COLONOSCOPY N/A 04/25/2013   Procedure: COLONOSCOPY;  Surgeon: Malissa HippoNajeeb U Rehman, MD;  Location: [facility];  Service: Endoscopy;  Laterality: N/A;  830  .  CYSTOSCOPY/RETROGRADE/URETEROSCOPY/STONE EXTRACTION WITH BASKET  2011   with stents, [facility]; [facility]  . LITHOTRIPSY    . WISDOM TOOTH EXTRACTION      Family History  Problem Relation Age of Onset  . Colon cancer Neg Hx     Social History Social History  Substance Use Topics  . Smoking status: Never Smoker  . Smokeless tobacco: Current User    Types: Chew  . Alcohol use No    No Known Allergies  Current Outpatient Prescriptions  Medication Sig Dispense Refill  . acetaminophen (TYLENOL) 500 MG tablet Take 500 mg by mouth every 6 (six) hours as needed. For pain    . cyclobenzaprine (FLEXERIL) 10 MG tablet Take 1 tablet (10 mg total) by mouth 3 (three) times daily as needed for muscle spasms. 20 tablet 0  . Melatonin 3 MG TABS Take 1 tablet by mouth once a week. Only takes rarely as needed For sleep    . metFORMIN (GLUCOPHAGE) 500 MG tablet Take 500 mg by mouth 2 (two) times daily.  3  . nabumetone (RELAFEN) 750 MG tablet Take 750 mg by mouth daily.    . naproxen sodium (ALEVE) 220 MG tablet Take 220 mg by mouth once as needed (for pain).    Marland Kitchen. oxyCODONE-acetaminophen (PERCOCET/ROXICET) 5-325 MG per tablet Take 1 tablet by mouth every 6 (six) hours as needed for pain. (Patient not taking: Reported on 03/29/2016) 20 tablet 0  .  rOPINIRole (REQUIP) 0.5 MG tablet Take 0.5 mg by mouth 3 (three) times daily.     No current facility-administered medications for this visit.      Physical Exam  Blood pressure 120/77, pulse 78, temperature 97.7 F (36.5 C), height 5\' 10"  (1.778 m), weight 238 lb (108 kg).  Constitutional: overall normal hygiene, normal nutrition, well developed, normal grooming, normal body habitus. Assistive device:none  Musculoskeletal: gait and station Limp none, muscle tone and strength are normal, no tremors or atrophy is present.  .  Neurological: coordination overall normal.  Deep tendon reflex/nerve stretch intact.  Sensation normal.  Cranial nerves II-XII intact.    Skin:   Normal overall no scars, lesions, ulcers or rashes. No psoriasis.  Psychiatric: Alert and oriented x 3.  Recent memory intact, remote memory unclear.  Normal mood and affect. Well groomed.  Good eye contact.  Cardiovascular: overall no swelling, no varicosities, no edema bilaterally, normal temperatures of the legs and arms, no clubbing, cyanosis and good capillary refill.  Lymphatic: palpation is normal.  He has decreased sensation of the right little finger and part of the ring, has positive Tinel on the ulnar nerve at the elbow and decreased intrinsic muscle strength of the fingers on the right.  He is left hand dominant. Grips are normal.  The patient has been educated about the nature of the problem(s) and counseled on treatment options.  The patient appeared to understand what I have discussed and is in agreement with it.  Encounter Diagnosis  Name Primary?  . Tardy ulnar nerve palsy, right Yes    PLAN Call if any problems.  Precautions discussed.  Continue current medications.   Return to clinic after EMG   Electronically Signed Darreld Mclean, MD 10/24/201712:09 PM

## 2016-06-21 ENCOUNTER — Encounter: Payer: Self-pay | Admitting: Orthopaedic Surgery

## 2016-06-21 ENCOUNTER — Ambulatory Visit (INDEPENDENT_AMBULATORY_CARE_PROVIDER_SITE_OTHER): Payer: 59 | Admitting: Orthopaedic Surgery

## 2016-06-21 VITALS — BP 127/82 | HR 103 | Temp 97.7°F | Ht 70.0 in | Wt 242.0 lb

## 2016-06-21 DIAGNOSIS — G5621 Lesion of ulnar nerve, right upper limb: Secondary | ICD-10-CM | POA: Diagnosis not present

## 2016-06-21 NOTE — Progress Notes (Signed)
Note reviewed and case reviewed with Dr. Hilda LiasKeeling  Patient has a positive nerve conduction study for right ulnar nerve palsy at the elbow  The patient did not one a splint the arm as I described as a possible treatment  He would like to have the surgery done prior to the end of the year  We discussed various treatment techniques and settled on a simple right cubital tunnel release at the elbow with the option to transfer the ulnar nerve anteriorly if the nerve shows any subluxation at the time of surgery  He will call back to give the date that he can do the surgery  (I can do the surgery either November 30 or December 7

## 2016-06-21 NOTE — Patient Instructions (Addendum)
Cubital Tunnel Syndrome Cubital tunnel syndrome is a condition that causes pain and weakness of the forearm and hand. This condition happens when one of the nerves (ulnar nerve) that runs alongside the elbow joint becomes irritated. CAUSES Causes of this condition include:  Increased pressure on the ulnar nerve at the elbow, arm, or forearm. This can be caused by:  Swollen tissues.  Ligaments.  Muscles.  Poorly healed elbow fractures.  Tumors in the elbow. These are usually noncancerous (benign).  Scar tissue that develops in the elbow after an injury.  Bony growths (spurs) near the ulnar nerve.  Stretching of the nerve due to loose elbow ligaments.  Trauma to the nerve at the elbow.  Repetitive elbow bending.  Certain medical conditions. RISK FACTORS: This condition is more likely to develop in:  People who do manual labor that requires frequently bending the elbow.  People who play sports that include repeated or strenuous throwing motions, such as baseball.  People who play contact sports, such as football or lacrosse.  People who do not warm up properly before activities.  People who have diabetes.  People who have an underactive thyroid (hypothyroidism). SYMPTOMS Symptoms of this condition include:  Clumsiness or weakness of the hand.  Tenderness of the inner elbow.  Aching or soreness of the inner elbow, forearm, or fingers, especially the little finger or the ring finger.  Increased pain with forced elbow bending.  Reduced control when throwing.  Tingling, numbness, or burning inside the forearm, or in part of the hand or fingers, especially the little finger or the ring finger.  Sharp pains that shoot from the elbow down to the wrist and hand.  The inability to grip or pinch hard. DIAGNOSIS This condition is diagnosed with a medical history and physical exam. Your health care provider will ask about your symptoms and ask for details about any  injury. You may also have other tests, including:  Electromyogram (EMG). This test checks how well the nerve is working.  X-ray. TREATMENT Treatment starts by stopping the activities that are causing your symptoms to get worse. Treatment may include the use of icing and medicines to reduce pain and swelling. You may also be advised to wear a splint to prevent your elbow from bending or wear an elbow pad where the ulnar nerve is closest to the skin. In less severe cases, treatment may also include working with a physical therapist:  To help decrease your symptoms.  To improve the strength and range of motion of your elbow, forearm, and hand. If the treatments described above do not help, surgery may be needed. HOME CARE INSTRUCTIONS If You Have a Splint:  Wear it as told by your health care provider. Remove it only as told by your health care provider.  Loosen the splint if your fingers become numb and tingle, or if they turn cold and blue.  Keep the splint clean and dry. Managing Pain, Stiffness, and Swelling  If directed, apply ice to the injured area:  Put ice in a plastic bag.  Place a towel between your skin and the bag.  Leave the ice on for 20 minutes, 2-3 times per day.  Move your fingers often to avoid stiffness and to lessen swelling.  Raise (elevate) the injured area above the level of your heart while you are sitting or lying down. General Instructions  Take over-the-counter and prescription medicines only as told by your health care provider.  Keep all follow-up visits as told by   your health care provider. This is important.  Do any exercise or physical therapy as told by your health care provider.  Do not drive or operate heavy machinery while taking prescription pain medicine.  If you were given an elbow pad, wear it as told by your health care provider. SEEK MEDICAL CARE IF:  Your symptoms get worse.  Your symptoms do not get better with  treatment.  Your have new pain.  Your hand on the injured side feels numb or cold.   This information is not intended to replace advice given to you by your health care provider. Make sure you discuss any questions you have with your health care provider.   Document Released: 08/01/2005 Document Revised: 04/22/2015 Document Reviewed: 10/08/2014 Elsevier Interactive Patient Education 2016 [employer]  

## 2016-06-21 NOTE — Progress Notes (Signed)
Patient ZO:[id number]:Jesus Mack GuiseN Carlberg, male DOB:1961/02/22, 55 y.o. [id number]  Chief Complaint  Patient presents with  . Results    EEG    HPI  Jesus Ingram is a 55 y.o. male who has pain and numbness of right nondominant hand in ulnar nerve distribution.  He had EMG which shows slowing at the right elbow consistent with right ulnar nerve tardy.  I have told him surgery may help.  I will have Dr. Romeo AppleHarrison see him.  The patient is agreeable. HPI  Body mass index is 34.72 kg/m.  ROS  Review of Systems  HENT: Negative for congestion.   Respiratory: Negative for cough and shortness of breath.   Cardiovascular: Negative for chest pain and leg swelling.  Endocrine: Negative for cold intolerance.  Musculoskeletal: Positive for arthralgias and back pain.  Allergic/Immunologic: Negative for environmental allergies.    Past Medical History:  Diagnosis Date  . Borderline diabetes   . Chronic back pain   . GERD (gastroesophageal reflux disease)   . History of renal calculi   . Reflux    occasional, uses TUMS  . Ruptured disc, thoracic   . Seasonal allergies     Past Surgical History:  Procedure Laterality Date  . CATARACT EXTRACTION W/PHACO  01/10/2012   Procedure: CATARACT EXTRACTION PHACO AND INTRAOCULAR LENS PLACEMENT (IOC);  Surgeon: Loraine LericheMark T. Nile RiggsShapiro, MD;  Location: [facility];  Service: Ophthalmology;  Laterality: Right;  CDE 9.77  . CATARACT EXTRACTION W/PHACO Left 01/13/2015   Procedure: CATARACT EXTRACTION PHACO AND INTRAOCULAR LENS PLACEMENT (IOC);  Surgeon: Jethro BolusMark Shapiro, MD;  Location: [facility];  Service: Ophthalmology;  Laterality: Left;  CDE:3.08  . COLONOSCOPY N/A 04/25/2013   Procedure: COLONOSCOPY;  Surgeon: Malissa HippoNajeeb U Rehman, MD;  Location: [facility];  Service: Endoscopy;  Laterality: N/A;  830  . CYSTOSCOPY/RETROGRADE/URETEROSCOPY/STONE EXTRACTION WITH BASKET  2011   with stents, [facility]; [facility]  . LITHOTRIPSY    . WISDOM TOOTH EXTRACTION      Family History  Problem  Relation Age of Onset  . Colon cancer Neg Hx     Social History Social History  Substance Use Topics  . Smoking status: Never Smoker  . Smokeless tobacco: Current User    Types: Chew  . Alcohol use No    No Known Allergies  Current Outpatient Prescriptions  Medication Sig Dispense Refill  . acetaminophen (TYLENOL) 500 MG tablet Take 500 mg by mouth every 6 (six) hours as needed. For pain    . cyclobenzaprine (FLEXERIL) 10 MG tablet Take 1 tablet (10 mg total) by mouth 3 (three) times daily as needed for muscle spasms. 20 tablet 0  . Melatonin 3 MG TABS Take 1 tablet by mouth once a week. Only takes rarely as needed For sleep    . metFORMIN (GLUCOPHAGE) 500 MG tablet Take 500 mg by mouth 2 (two) times daily.  3  . nabumetone (RELAFEN) 750 MG tablet Take 750 mg by mouth daily.    . naproxen sodium (ALEVE) 220 MG tablet Take 220 mg by mouth once as needed (for pain).    Marland Kitchen. oxyCODONE-acetaminophen (PERCOCET/ROXICET) 5-325 MG per tablet Take 1 tablet by mouth every 6 (six) hours as needed for pain. 20 tablet 0  . rOPINIRole (REQUIP) 0.5 MG tablet Take 0.5 mg by mouth 3 (three) times daily.     No current facility-administered medications for this visit.      Physical Exam  Blood pressure 127/82, pulse (!) 103, temperature 97.7 F (36.5 C), height 5'  10" (1.778 m), weight 242 lb (109.8 kg).  Constitutional: overall normal hygiene, normal nutrition, well developed, normal grooming, normal body habitus. Assistive device:none  Musculoskeletal: gait and station Limp none, muscle tone and strength are normal, no tremors or atrophy is present.  .  Neurological: coordination overall normal.  Deep tendon reflex/nerve stretch intact.  Sensation normal.  Cranial nerves II-XII intact.   Skin:   Normal overall no scars, lesions, ulcers or rashes. No psoriasis.  Psychiatric: Alert and oriented x 3.  Recent memory intact, remote memory unclear.  Normal mood and affect. Well groomed.  Good eye  contact.  Cardiovascular: overall no swelling, no varicosities, no edema bilaterally, normal temperatures of the legs and arms, no clubbing, cyanosis and good capillary refill.  Lymphatic: palpation is normal.  He has positive Tinel over the ulnar nerve on the right and decreased sensation of the ulnar nerve on the right.  Intrinsics are slightly weak on the right.  He is left hand dominant.  The patient has been educated about the nature of the problem(s) and counseled on treatment options.  The patient appeared to understand what I have discussed and is in agreement with it.  Encounter Diagnosis  Name Primary?  . Tardy ulnar nerve palsy, right Yes    PLAN Call if any problems.  Precautions discussed.  Continue current medications.   Return to clinic to see Dr. Romeo AppleHarrison   Electronically Signed Darreld McleanWayne Carolena Fairbank, MD 11/7/20173:48 PM

## 2016-06-22 ENCOUNTER — Telehealth: Payer: Self-pay | Admitting: Orthopedic Surgery

## 2016-06-22 NOTE — Telephone Encounter (Signed)
Patient agreeable to surgery 07/14/16

## 2016-06-22 NOTE — Telephone Encounter (Signed)
Patient called to speak with you about scheduling surgery.   Please call and advise.

## 2016-06-24 ENCOUNTER — Other Ambulatory Visit: Payer: Self-pay | Admitting: *Deleted

## 2016-06-27 ENCOUNTER — Encounter: Payer: Self-pay | Admitting: Orthopedic Surgery

## 2016-06-30 ENCOUNTER — Encounter: Payer: Self-pay | Admitting: *Deleted

## 2016-06-30 NOTE — Progress Notes (Unsigned)
Surgical pre authorization not required for cpt 64718  Reference  Robert A.  [id number]

## 2016-07-01 NOTE — Patient Instructions (Signed)
Jesus Ingram  07/01/2016     @PREFPERIOPPHARMACY @   Your procedure is scheduled on  07/14/2016   Report to [facility] at  1230  P.M.  Call this number if you have problems the morning of surgery:  [phone]   Remember:  Do not eat food or drink liquids after midnight.  Take these medicines the morning of surgery with A SIP OF WATER  Relafen, requip.   Do not wear jewelry, make-up or nail polish.  Do not wear lotions, powders, or perfumes, or deoderant.  Do not shave 48 hours prior to surgery.  Men may shave face and neck.  Do not bring valuables to the hospital.  [facility] is not responsible for any belongings or valuables.  Contacts, dentures or bridgework may not be worn into surgery.  Leave your suitcase in the car.  After surgery it may be brought to your room.  For patients admitted to the hospital, discharge time will be determined by your treatment team.  Patients discharged the day of surgery will not be allowed to drive home.   Name and phone number of your driver:   family Special instructions:  none  Please read over the following fact sheets that you were given. Anesthesia Post-op Instructions and Care and Recovery After Surgery       Incision Care, Adult An incision is a cut that a doctor makes in your skin for surgery (for a procedure). Most times, these cuts are closed after surgery. Your cut from surgery may be closed with stitches (sutures), staples, skin glue, or skin tape (adhesive strips). You may need to return to your doctor to have stitches or staples taken out. This may happen many days or many weeks after your surgery. The cut needs to be well cared for so it does not get infected. How to care for your cut Cut care  Follow instructions from your doctor about how to take care of your cut. Make sure you:  Wash your hands with soap and water before you change your bandage (dressing). If you cannot use soap and water, use hand  sanitizer.  Change your bandage as told by your doctor.  Leave stitches, skin glue, or skin tape in place. They may need to stay in place for 2 weeks or longer. If tape strips get loose and curl up, you may trim the loose edges. Do not remove tape strips completely unless your doctor says it is okay.  Check your cut area every day for signs of infection. Check for:  More redness, swelling, or pain.  More fluid or blood.  Warmth.  Pus or a bad smell.  Ask your doctor how to clean the cut. This may include:  Using mild soap and water.  Using a clean towel to pat the cut dry after you clean it.  Putting a cream or ointment on the cut. Do this only as told by your doctor.  Covering the cut with a clean bandage.  Ask your doctor when you can leave the cut uncovered.  Do not take baths, swim, or use a hot tub until your doctor says it is okay. Ask your doctor if you can take showers. You may only be allowed to take sponge baths for bathing. Medicines  If you were prescribed an antibiotic medicine, cream, or ointment, take the antibiotic or put it on the cut as told by your doctor. Do not stop taking or putting  on the antibiotic even if your condition gets better.  Take over-the-counter and prescription medicines only as told by your doctor. General instructions  Limit movement around your cut. This helps healing.  Avoid straining, lifting, or exercise for the first month, or for as long as told by your doctor.  Follow instructions from your doctor about going back to your normal activities.  Ask your doctor what activities are safe.  Protect your cut from the sun when you are outside for the first 6 months, or for as long as told by your doctor. Put on sunscreen around the scar or cover up the scar.  Keep all follow-up visits as told by your doctor. This is important. Contact a doctor if:  Your have more redness, swelling, or pain around the cut.  You have more fluid or  blood coming from the cut.  Your cut feels warm to the touch.  You have pus or a bad smell coming from the cut.  You have a fever or shaking chills.  You feel sick to your stomach (nauseous) or you throw up (vomit).  You are dizzy.  Your stitches or staples come undone. Get help right away if:  You have a red streak coming from your cut.  Your cut bleeds through the bandage and the bleeding does not stop with gentle pressure.  The edges of your cut open up and separate.  You have very bad (severe) pain.  You have a rash.  You are confused.  You pass out (faint).  You have trouble breathing and you have a fast heartbeat. This information is not intended to replace advice given to you by your health care provider. Make sure you discuss any questions you have with your health care provider. Document Released: 10/24/2011 Document Revised: 04/08/2016 Document Reviewed: 04/08/2016 Elsevier Interactive Patient Education  2017 [employer] Cubital Tunnel Syndrome Introduction Cubital tunnel syndrome is a condition that causes pain and weakness of the forearm and hand. This condition happens when one of the nerves (ulnar nerve) that runs alongside the elbow joint becomes irritated. What are the causes? Causes of this condition include:  Increased pressure on the ulnar nerve at the elbow, arm, or forearm. This can be caused by:  Swollen tissues.  Ligaments.  Muscles.  Poorly healed elbow fractures.  Tumors in the elbow. These are usually noncancerous (benign).  Scar tissue that develops in the elbow after an injury.  Bony growths (spurs) near the ulnar nerve.  Stretching of the nerve due to loose elbow ligaments.  Trauma to the nerve at the elbow.  Repetitive elbow bending.  Certain medical conditions. What increases the risk? This condition is more likely to develop in:  People who do manual labor that requires frequently bending the elbow.  People who play  sports that include repeated or strenuous throwing motions, such as baseball.  People who play contact sports, such as football or lacrosse.  People who do not warm up properly before activities.  People who have diabetes.  People who have an underactive thyroid (hypothyroidism). What are the signs or symptoms? Symptoms of this condition include:  Clumsiness or weakness of the hand.  Tenderness of the inner elbow.  Aching or soreness of the inner elbow, forearm, or fingers, especially the little finger or the ring finger.  Increased pain with forced elbow bending.  Reduced control when throwing.  Tingling, numbness, or burning inside the forearm, or in part of the hand or fingers, especially the little finger  or the ring finger.  Sharp pains that shoot from the elbow down to the wrist and hand.  The inability to grip or pinch hard. How is this diagnosed? This condition is diagnosed with a medical history and physical exam. Your health care provider will ask about your symptoms and ask for details about any injury. You may also have other tests, including:  Electromyogram (EMG). This test checks how well the nerve is working.  X-ray. How is this treated? Treatment starts by stopping the activities that are causing your symptoms to get worse. Treatment may include the use of icing and medicines to reduce pain and swelling. You may also be advised to wear a splint to prevent your elbow from bending or wear an elbow pad where the ulnar nerve is closest to the skin. In less severe cases, treatment may also include working with a physical therapist:  To help decrease your symptoms.  To improve the strength and range of motion of your elbow, forearm, and hand. If the treatments described above do not help, surgery may be needed. Follow these instructions at home: If you have a splint:  Wear it as told by your health care provider. Remove it only as told by your health care  provider.  Loosen the splint if your fingers become numb and tingle, or if they turn cold and blue.  Keep the splint clean and dry. Managing pain, stiffness, and swelling  If directed, apply ice to the injured area:  Put ice in a plastic bag.  Place a towel between your skin and the bag.  Leave the ice on for 20 minutes, 2-3 times per day.  Move your fingers often to avoid stiffness and to lessen swelling.  Raise (elevate) the injured area above the level of your heart while you are sitting or lying down. General instructions  Take over-the-counter and prescription medicines only as told by your health care provider.  Keep all follow-up visits as told by your health care provider. This is important.  Do any exercise or physical therapy as told by your health care provider.  Do not drive or operate heavy machinery while taking prescription pain medicine.  If you were given an elbow pad, wear it as told by your health care provider. Contact a health care provider if:  Your symptoms get worse.  Your symptoms do not get better with treatment.  Your have new pain.  Your hand on the injured side feels numb or cold. This information is not intended to replace advice given to you by your health care provider. Make sure you discuss any questions you have with your health care provider. Document Released: 08/01/2005 Document Revised: 01/07/2016 Document Reviewed: 10/08/2014  2017 Elsevier PATIENT INSTRUCTIONS POST-ANESTHESIA  IMMEDIATELY FOLLOWING SURGERY:  Do not drive or operate machinery for the first twenty four hours after surgery.  Do not make any important decisions for twenty four hours after surgery or while taking narcotic pain medications or sedatives.  If you develop intractable nausea and vomiting or a severe headache please notify your doctor immediately.  FOLLOW-UP:  Please make an appointment with your surgeon as instructed. You do not need to follow up with  anesthesia unless specifically instructed to do so.  WOUND CARE INSTRUCTIONS (if applicable):  Keep a dry clean dressing on the anesthesia/puncture wound site if there is drainage.  Once the wound has quit draining you may leave it open to air.  Generally you should leave the bandage intact for twenty  four hours unless there is drainage.  If the epidural site drains for more than 36-48 hours please call the anesthesia department.  QUESTIONS?:  Please feel free to call your physician or the hospital operator if you have any questions, and they will be happy to assist you.

## 2016-07-05 ENCOUNTER — Encounter (HOSPITAL_COMMUNITY)
Admission: RE | Admit: 2016-07-05 | Discharge: 2016-07-05 | Disposition: A | Discharge status: Home / Self Care | Payer: 59 | Source: Ambulatory Visit | Attending: Orthopedic Surgery | Admitting: Orthopedic Surgery

## 2016-07-05 ENCOUNTER — Encounter (HOSPITAL_COMMUNITY): Payer: Self-pay

## 2016-07-05 DIAGNOSIS — Z01812 Encounter for preprocedural laboratory examination: Secondary | ICD-10-CM | POA: Insufficient documentation

## 2016-07-05 DIAGNOSIS — Z01818 Encounter for other preprocedural examination: Secondary | ICD-10-CM | POA: Diagnosis present

## 2016-07-05 DIAGNOSIS — G5621 Lesion of ulnar nerve, right upper limb: Secondary | ICD-10-CM | POA: Insufficient documentation

## 2016-07-05 HISTORY — DX: Restless legs syndrome: G25.81

## 2016-07-05 HISTORY — DX: Personal history of urinary calculi: Z87.442

## 2016-07-05 HISTORY — DX: Type 2 diabetes mellitus without complications: E11.9

## 2016-07-05 HISTORY — DX: Sleep apnea, unspecified: G47.30

## 2016-07-05 HISTORY — DX: Unspecified osteoarthritis, unspecified site: M19.90

## 2016-07-05 LAB — CBC WITH DIFFERENTIAL/PLATELET
Basophils Absolute: 0 10*3/uL (ref 0.0–0.1)
Basophils Relative: 1 %
EOS ABS: 0.5 10*3/uL (ref 0.0–0.7)
EOS PCT: 5 %
HCT: 44.7 % (ref 39.0–52.0)
Hemoglobin: 15.5 g/dL (ref 13.0–17.0)
LYMPHS ABS: 2.3 10*3/uL (ref 0.7–4.0)
Lymphocytes Relative: 28 %
MCH: 30.7 pg (ref 26.0–34.0)
MCHC: 34.7 g/dL (ref 30.0–36.0)
MCV: 88.5 fL (ref 78.0–100.0)
Monocytes Absolute: 0.7 10*3/uL (ref 0.1–1.0)
Monocytes Relative: 8 %
Neutro Abs: 4.9 10*3/uL (ref 1.7–7.7)
Neutrophils Relative %: 58 %
Platelets: 211 10*3/uL (ref 150–400)
RBC: 5.05 MIL/uL (ref 4.22–5.81)
RDW: 13.2 % (ref 11.5–15.5)
WBC: 8.3 10*3/uL (ref 4.0–10.5)

## 2016-07-05 LAB — BASIC METABOLIC PANEL
Anion gap: 7 (ref 5–15)
BUN: 14 mg/dL (ref 6–20)
CALCIUM: 9.2 mg/dL (ref 8.9–10.3)
CO2: 25 mmol/L (ref 22–32)
Chloride: 106 mmol/L (ref 101–111)
Creatinine, Ser: 1.13 mg/dL (ref 0.61–1.24)
GFR calc Af Amer: >60 mL/min (ref >60)
Glucose, Bld: 117 mg/dL — ABNORMAL HIGH (ref 65–99)
POTASSIUM: 4 mmol/L (ref 3.5–5.1)
SODIUM: 138 mmol/L (ref 135–145)

## 2016-07-13 NOTE — H&P (Signed)
Jesus Ingram is an 55 y.o. male.   Chief Complaint: Medial elbow pain  History  This 55 year old male presented to Dr. Hilda LiasKeeling with pain in the medial aspect of his right elbow. He had nerve conduction EMG study which showed slowing across the right elbow consistent with cubital tunnel syndrome. This became unbearable for him and he opted for surgery when presented with splinting as an option and continued medical management.   Past Medical History:  Diagnosis Date  . Arthritis   . Borderline diabetes   . Chronic back pain   . Diabetes mellitus without complication (HCC)   . GERD (gastroesophageal reflux disease)   . History of kidney stones    history of kidney stones.  . History of renal calculi   . Reflux    occasional, uses TUMS  . Restless leg syndrome   . Ruptured disc, thoracic   . Seasonal allergies   . Sleep apnea    lost weight and has gotten better    Past Surgical History:  Procedure Laterality Date  . CATARACT EXTRACTION W/PHACO  01/10/2012   Procedure: CATARACT EXTRACTION PHACO AND INTRAOCULAR LENS PLACEMENT (IOC);  Surgeon: Loraine LericheMark T. Nile RiggsShapiro, MD;  Location: [facility];  Service: Ophthalmology;  Laterality: Right;  CDE 9.77  . CATARACT EXTRACTION W/PHACO Left 01/13/2015   Procedure: CATARACT EXTRACTION PHACO AND INTRAOCULAR LENS PLACEMENT (IOC);  Surgeon: Jethro BolusMark Shapiro, MD;  Location: [facility];  Service: Ophthalmology;  Laterality: Left;  CDE:3.08  . COLONOSCOPY N/A 04/25/2013   Procedure: COLONOSCOPY;  Surgeon: Malissa HippoNajeeb U Rehman, MD;  Location: [facility];  Service: Endoscopy;  Laterality: N/A;  830  . CYSTOSCOPY/RETROGRADE/URETEROSCOPY/STONE EXTRACTION WITH BASKET  2011   with stents, [facility]; [place]  . LITHOTRIPSY    . WISDOM TOOTH EXTRACTION      Family History  Problem Relation Age of Onset  . Colon cancer Neg Hx    Social History:  reports that he has never smoked. His smokeless tobacco use includes Chew. He reports that he does not drink alcohol or use  drugs.  Allergies: No Known Allergies  No prescriptions prior to admission.    No results found for this or any previous visit (from the past 48 hour(s)). No results found.  Review of Systems  Constitutional: Negative for fever.  Respiratory: Negative.   Cardiovascular: Negative for chest pain.  Gastrointestinal: Negative.   Genitourinary: Negative.   All other systems reviewed and are negative.   There were no vitals taken for this visit. Physical Exam  Gen. appearance is normal. He is oriented to person place and time. Mood and affect are pleasant and upbeat.  Gait and station are normal  Right upper extremity he is tender over the medial epicondyles with normal range of motion. The elbow is stable to varus valgus stress testing and he has normal flexion extension power of the elbow. We do note some weakness in the grip strength related to the right hand. Skin over the right elbow is normal without nodularity or erythema. Pulse and perfusion are normal in the right upper extremity with no lymphadenopathy noted to palpation. He does have decreased sensation in positive Tinel's and the ulnar nerve distribution including decreased sensation in the small finger and a portion of the ring finger. Deep tendon and pathologic reflexes examination revealed no abnormalities and he had normal coordination in his extremity  His other extremities show no clubbing cyanosis or edema no contracture subluxation atrophy or tremor   Assessment/Plan Cubital tunnel syndrome  of the right elbow  In situ cubital tunnel release right elbow  Fuller CanadaStanley Harrison, MD 07/13/2016, 12:04 PM

## 2016-07-14 ENCOUNTER — Encounter (HOSPITAL_COMMUNITY): Payer: Self-pay

## 2016-07-14 ENCOUNTER — Encounter (HOSPITAL_COMMUNITY)
Admission: RE | Disposition: A | Discharge status: Home / Self Care | Payer: Self-pay | Source: Ambulatory Visit | Attending: Orthopedic Surgery

## 2016-07-14 ENCOUNTER — Ambulatory Visit (HOSPITAL_COMMUNITY)
Admission: RE | Admit: 2016-07-14 | Discharge: 2016-07-14 | Disposition: A | Discharge status: Home / Self Care | Payer: 59 | Source: Ambulatory Visit | Attending: Orthopedic Surgery | Admitting: Orthopedic Surgery

## 2016-07-14 ENCOUNTER — Ambulatory Visit (HOSPITAL_COMMUNITY): Payer: 59 | Admitting: Anesthesiology

## 2016-07-14 ENCOUNTER — Ambulatory Visit (HOSPITAL_COMMUNITY): Payer: 59

## 2016-07-14 DIAGNOSIS — G5621 Lesion of ulnar nerve, right upper limb: Secondary | ICD-10-CM | POA: Diagnosis not present

## 2016-07-14 DIAGNOSIS — K219 Gastro-esophageal reflux disease without esophagitis: Secondary | ICD-10-CM | POA: Insufficient documentation

## 2016-07-14 DIAGNOSIS — E119 Type 2 diabetes mellitus without complications: Secondary | ICD-10-CM | POA: Insufficient documentation

## 2016-07-14 DIAGNOSIS — F17220 Nicotine dependence, chewing tobacco, uncomplicated: Secondary | ICD-10-CM | POA: Insufficient documentation

## 2016-07-14 DIAGNOSIS — Z7984 Long term (current) use of oral hypoglycemic drugs: Secondary | ICD-10-CM | POA: Diagnosis not present

## 2016-07-14 DIAGNOSIS — G2581 Restless legs syndrome: Secondary | ICD-10-CM | POA: Diagnosis not present

## 2016-07-14 HISTORY — PX: ULNAR TUNNEL RELEASE: SHX820

## 2016-07-14 LAB — GLUCOSE, CAPILLARY
GLUCOSE-CAPILLARY: 104 mg/dL — AB (ref 65–99)
Glucose-Capillary: 101 mg/dL — ABNORMAL HIGH (ref 65–99)

## 2016-07-14 SURGERY — RELEASE, CUBITAL TUNNEL
Anesthesia: General | Site: Arm Upper | Laterality: Right

## 2016-07-14 MED ORDER — ACETAMINOPHEN-CODEINE #3 300-30 MG PO TABS: 1.0000 | ORAL_TABLET | ORAL | 0 refills | Status: DC | PRN

## 2016-07-14 MED ORDER — MIDAZOLAM HCL 2 MG/2ML IJ SOLN
INTRAMUSCULAR | Status: AC
  Filled 2016-07-14: qty 2

## 2016-07-14 MED ORDER — FENTANYL CITRATE (PF) 100 MCG/2ML IJ SOLN: 25.0000 ug | INTRAMUSCULAR | Status: DC | PRN

## 2016-07-14 MED ORDER — PROPOFOL 10 MG/ML IV BOLUS
INTRAVENOUS | Status: AC
  Filled 2016-07-14: qty 20

## 2016-07-14 MED ORDER — LABETALOL HCL 5 MG/ML IV SOLN
10.0000 mg | Freq: Once | INTRAVENOUS | Status: AC
  Administered 2016-07-14: 10 mg via INTRAVENOUS

## 2016-07-14 MED ORDER — LIDOCAINE HCL (PF) 1 % IJ SOLN
INTRAMUSCULAR | Status: AC
  Filled 2016-07-14: qty 5

## 2016-07-14 MED ORDER — FENTANYL CITRATE (PF) 100 MCG/2ML IJ SOLN
INTRAMUSCULAR | Status: DC | PRN
  Administered 2016-07-14: 25 ug via INTRAVENOUS
  Administered 2016-07-14: 50 ug via INTRAVENOUS
  Administered 2016-07-14: 25 ug via INTRAVENOUS

## 2016-07-14 MED ORDER — GLYCOPYRROLATE 0.2 MG/ML IJ SOLN
INTRAMUSCULAR | Status: DC | PRN
  Administered 2016-07-14: 0.2 mg via INTRAVENOUS

## 2016-07-14 MED ORDER — SUCCINYLCHOLINE CHLORIDE 20 MG/ML IJ SOLN
INTRAMUSCULAR | Status: AC
  Filled 2016-07-14: qty 1

## 2016-07-14 MED ORDER — SUCCINYLCHOLINE CHLORIDE 20 MG/ML IJ SOLN
INTRAMUSCULAR | Status: DC | PRN
  Administered 2016-07-14: 30 mg via INTRAVENOUS

## 2016-07-14 MED ORDER — 0.9 % SODIUM CHLORIDE (POUR BTL) OPTIME
TOPICAL | Status: DC | PRN
  Administered 2016-07-14: 1000 mL

## 2016-07-14 MED ORDER — MIDAZOLAM HCL 2 MG/2ML IJ SOLN
1.0000 mg | INTRAMUSCULAR | Status: DC | PRN
  Administered 2016-07-14: 2 mg via INTRAVENOUS

## 2016-07-14 MED ORDER — ONDANSETRON HCL 4 MG/2ML IJ SOLN
4.0000 mg | Freq: Once | INTRAMUSCULAR | Status: AC
  Administered 2016-07-14: 4 mg via INTRAVENOUS
  Filled 2016-07-14: qty 2

## 2016-07-14 MED ORDER — CHLORHEXIDINE GLUCONATE 4 % EX LIQD: 60.0000 mL | Freq: Once | CUTANEOUS | Status: DC

## 2016-07-14 MED ORDER — LABETALOL HCL 5 MG/ML IV SOLN
INTRAVENOUS | Status: AC
  Filled 2016-07-14: qty 4

## 2016-07-14 MED ORDER — CEFAZOLIN SODIUM-DEXTROSE 2-4 GM/100ML-% IV SOLN
2.0000 g | INTRAVENOUS | Status: AC
  Administered 2016-07-14: 2 g via INTRAVENOUS
  Filled 2016-07-14: qty 100

## 2016-07-14 MED ORDER — LIDOCAINE HCL (CARDIAC) 20 MG/ML IV SOLN
INTRAVENOUS | Status: DC | PRN
Start: 2016-07-14 — End: 2016-07-14
  Administered 2016-07-14: 50 mg via INTRAVENOUS

## 2016-07-14 MED ORDER — BUPIVACAINE HCL (PF) 0.5 % IJ SOLN
INTRAMUSCULAR | Status: DC | PRN
  Administered 2016-07-14: 20 mL

## 2016-07-14 MED ORDER — FENTANYL CITRATE (PF) 100 MCG/2ML IJ SOLN
25.0000 ug | INTRAMUSCULAR | Status: AC | PRN
  Administered 2016-07-14 (×2): 25 ug via INTRAVENOUS
  Filled 2016-07-14: qty 2

## 2016-07-14 MED ORDER — BUPIVACAINE HCL (PF) 0.5 % IJ SOLN
INTRAMUSCULAR | Status: AC
  Filled 2016-07-14: qty 30

## 2016-07-14 MED ORDER — FENTANYL CITRATE (PF) 100 MCG/2ML IJ SOLN
INTRAMUSCULAR | Status: AC
  Filled 2016-07-14: qty 2

## 2016-07-14 MED ORDER — LACTATED RINGERS IV SOLN
INTRAVENOUS | Status: DC
Start: 2016-07-14 — End: 2016-07-16
  Administered 2016-07-14: 1000 mL via INTRAVENOUS
  Administered 2016-07-14: 16:00:00 via INTRAVENOUS

## 2016-07-14 MED ORDER — PROPOFOL 10 MG/ML IV BOLUS
INTRAVENOUS | Status: DC | PRN
  Administered 2016-07-14: 20 mg via INTRAVENOUS
  Administered 2016-07-14: 150 mg via INTRAVENOUS

## 2016-07-14 SURGICAL SUPPLY — 39 items
BAG HAMPER (MISCELLANEOUS) ×2 IMPLANT
BANDAGE ELASTIC 4 LF NS (GAUZE/BANDAGES/DRESSINGS) ×1 IMPLANT
BANDAGE ESMARK 4X12 BL STRL LF (DISPOSABLE) ×1 IMPLANT
BNDG CMPR 12X4 ELC STRL LF (DISPOSABLE) ×1
BNDG CMPR MED 5X4 ELC HKLP NS (GAUZE/BANDAGES/DRESSINGS) ×1
BNDG COHESIVE 4X5 TAN STRL (GAUZE/BANDAGES/DRESSINGS) ×2 IMPLANT
BNDG ESMARK 4X12 BLUE STRL LF (DISPOSABLE) ×2
BNDG GAUZE ELAST 4 BULKY (GAUZE/BANDAGES/DRESSINGS) ×1 IMPLANT
CHLORAPREP W/TINT 26ML (MISCELLANEOUS) ×2 IMPLANT
CLOTH BEACON ORANGE TIMEOUT ST (SAFETY) ×2 IMPLANT
COVER LIGHT HANDLE STERIS (MISCELLANEOUS) ×4 IMPLANT
CUFF TOURNIQUET SINGLE 18IN (TOURNIQUET CUFF) ×2 IMPLANT
DECANTER SPIKE VIAL GLASS SM (MISCELLANEOUS) ×2 IMPLANT
DRAPE HALF SHEET 40X57 (DRAPES) ×1 IMPLANT
DRSG XEROFORM 1X8 (GAUZE/BANDAGES/DRESSINGS) ×1 IMPLANT
ELECT NDL TIP 2.8 STRL (NEEDLE) ×1 IMPLANT
ELECT NEEDLE TIP 2.8 STRL (NEEDLE) ×2 IMPLANT
ELECT REM PT RETURN 9FT ADLT (ELECTROSURGICAL) ×2
ELECTRODE REM PT RTRN 9FT ADLT (ELECTROSURGICAL) ×1 IMPLANT
GAUZE SPONGE 4X4 12PLY STRL (GAUZE/BANDAGES/DRESSINGS) ×1 IMPLANT
GLOVE BIOGEL PI IND STRL 7.0 (GLOVE) ×1 IMPLANT
GLOVE BIOGEL PI INDICATOR 7.0 (GLOVE) ×1
GLOVE ECLIPSE 6.5 STRL STRAW (GLOVE) ×1 IMPLANT
GLOVE EXAM NITRILE MD LF STRL (GLOVE) ×1 IMPLANT
GLOVE SKINSENSE NS SZ8.0 LF (GLOVE) ×1
GLOVE SKINSENSE STRL SZ8.0 LF (GLOVE) ×1 IMPLANT
GLOVE SS N UNI LF 8.5 STRL (GLOVE) ×2 IMPLANT
GOWN STRL REUS W/TWL LRG LVL3 (GOWN DISPOSABLE) ×4 IMPLANT
GOWN STRL REUS W/TWL XL LVL3 (GOWN DISPOSABLE) ×2 IMPLANT
KIT ROOM TURNOVER APOR (KITS) ×2 IMPLANT
MANIFOLD NEPTUNE II (INSTRUMENTS) ×2 IMPLANT
NDL HYPO 21X1.5 SAFETY (NEEDLE) ×1 IMPLANT
NEEDLE HYPO 21X1.5 SAFETY (NEEDLE) ×2 IMPLANT
NS IRRIG 1000ML POUR BTL (IV SOLUTION) ×2 IMPLANT
PACK BASIC LIMB (CUSTOM PROCEDURE TRAY) ×2 IMPLANT
PAD ARMBOARD 7.5X6 YLW CONV (MISCELLANEOUS) ×2 IMPLANT
SET BASIN LINEN APH (SET/KITS/TRAYS/PACK) ×2 IMPLANT
SUT ETHILON 3 0 FSL (SUTURE) ×2 IMPLANT
SUT MON AB 2-0 CT1 36 (SUTURE) ×1 IMPLANT

## 2016-07-14 NOTE — Anesthesia Postprocedure Evaluation (Signed)
Anesthesia Post Note  Patient: Jesus Ingram  Procedure(s) Performed: Procedure(s) (LRB): CUBITAL TUNNEL RELEASE (Right)  Anesthesia Post Evaluation  Last Vitals:  Vitals:   07/14/16 1630 07/14/16 1645  BP: 104/88 114/78  Pulse: 87 91  Resp: 16 19  Temp:      Last Pain:  Vitals:   07/14/16 1304  TempSrc: Oral  PainSc:      Pt awake and alert, O2 sat=97%, comfortable.   Spoke with Dr Juanetta GoslingHawkins (his primary) and he agrees with plan to discharge home tonight and he will follow up in the office tomorrow morning. Relayed this information to the pt and family.            Amit Meloy

## 2016-07-14 NOTE — Anesthesia Postprocedure Evaluation (Signed)
Anesthesia Post Note  Patient: Jesus Ingram  Procedure(s) Performed: Procedure(s) (LRB): CUBITAL TUNNEL RELEASE (Right)  Patient location during evaluation: PACU Anesthesia Type: General Level of consciousness: awake and alert and oriented Pain management: pain level controlled Vital Signs Assessment: post-procedure vital signs reviewed and stable Respiratory status: spontaneous breathing Cardiovascular status: stable Anesthetic complications: no    Last Vitals:  Vitals:   07/14/16 1420 07/14/16 1538  BP: 115/66   Pulse:    Resp: 14   Temp:  36.4 C    Last Pain:  Vitals:   07/14/16 1304  TempSrc: Oral  PainSc:                  ADAMS, AMY A

## 2016-07-14 NOTE — Progress Notes (Signed)
Wife will take patient to see Dr. Juanetta GoslingHawkins on Friday am 07/15/2016

## 2016-07-14 NOTE — Transfer of Care (Signed)
Immediate Anesthesia Transfer of Care Note  Patient: Jesus Ingram  Procedure(s) Performed: Procedure(s): CUBITAL TUNNEL RELEASE (Right)  Patient Location: PACU  Anesthesia Type:General  Level of Consciousness: awake, alert , oriented and patient cooperative  Airway & Oxygen Therapy: Patient Spontanous Breathing and Patient connected to nasal cannula oxygen  Post-op Assessment: Report given to RN and Post -op Vital signs reviewed and stable  Post vital signs: Reviewed and stable  Last Vitals:  Vitals:   07/14/16 1420 07/14/16 1538  BP: 115/66   Pulse:    Resp: 14   Temp:  36.4 C    Last Pain:  Vitals:   07/14/16 1304  TempSrc: Oral  PainSc:       Patients Stated Pain Goal: 9 (07/14/16 1244)  Complications: No apparent anesthesia complications

## 2016-07-14 NOTE — Anesthesia Preprocedure Evaluation (Signed)
Anesthesia Evaluation  Patient identified by MRN, date of birth, ID band Patient awake    Reviewed: Allergy & Precautions, NPO status , Patient's Chart, lab work & pertinent test results  History of Anesthesia Complications Negative for: history of anesthetic complications  Airway Mallampati: II  TM Distance: >3 FB     Dental  (+) Teeth Intact   Pulmonary neg pulmonary ROS, sleep apnea ,    breath sounds clear to auscultation       Cardiovascular negative cardio ROS   Rhythm:Regular Rate:Normal     Neuro/Psych    GI/Hepatic GERD  Medicated and Controlled,  Endo/Other  diabetes, Type 2, Oral Hypoglycemic Agents  Renal/GU      Musculoskeletal   Abdominal   Peds  Hematology   Anesthesia Other Findings   Reproductive/Obstetrics                             Anesthesia Physical Anesthesia Plan  ASA: II  Anesthesia Plan: General   Post-op Pain Management:    Induction: Intravenous  Airway Management Planned: LMA  Additional Equipment:   Intra-op Plan:   Post-operative Plan: Extubation in OR  Informed Consent: I have reviewed the patients History and Physical, chart, labs and discussed the procedure including the risks, benefits and alternatives for the proposed anesthesia with the patient or authorized representative who has indicated his/her understanding and acceptance.     Plan Discussed with:   Anesthesia Plan Comments:         Anesthesia Quick Evaluation

## 2016-07-14 NOTE — Op Note (Signed)
07/14/2016  3:40 PM  PATIENT:  Jesus Ingram  55 y.o. male  PRE-OPERATIVE DIAGNOSIS:  right cubital tunnel syndrome  POST-OPERATIVE DIAGNOSIS:  right cubital tunnel syndrome  Findings tight cubital tunnel especially in the forearm area at the pronator and flexor pronator group  PROCEDURE:  Procedure(s): CUBITAL TUNNEL RELEASE (Right)  SURGEON:  Surgeon(s) and Role:    * Vickki HearingStanley E Gracyn Santillanes, MD - Primary  PHYSICIAN ASSISTANT:   ASSISTANTS: none   ANESTHESIA:   general   Postoperatively the patient had a decrease in his saturations had to ventilate him by hand is brought to the recovery room stable with O2 sat in the low to mid 90s and will be observed there   EBL:  Total I/O In: 1000 [I.V.:1000] Out: -   BLOOD ADMINISTERED:none  DRAINS: none   LOCAL MEDICATIONS USED:  MARCAINE     SPECIMEN:  No Specimen  DISPOSITION OF SPECIMEN:  N/A  COUNTS:  YES  TOURNIQUET:   Total Tourniquet Time Documented: Upper Arm (Right) - 20 minutes Total: Upper Arm (Right) - 20 minutes   DICTATION: .Dragon Dictation   Proper site marking and chart update was completed in the preop area. Right elbow marked for surgery. Patient taken to surgery 2 g of Ancef were given he was given general anesthesia with LMA  After sterile prep and drape timeout was completed  An incision was made just proximal to the medial epicondyle curved posterior to it and then into the pronator flexor group. Subcutaneous tissue was divided bluntly looking for the medial antebrachial cutaneous nerve which was not found.  We found the nerve proximally and then bluntly carried out dissection distally into the flexor pronator mass. We divided the muscles off the nerve and then dissected proximally to the medial intermuscular septum  Flexion-extension to show the nerve to be stable  Wound was irrigated and closed with 2-0 Monocryl and staples  20 mL of Marcaine with epinephrine injected  Sterile  dressing  At the time of extubation the patient dropped his O2 saturations and had to be ventilated until his saturations improved to the low to mid 90s.  He will be observed in the recovery area    PLAN OF CARE: Pending  PATIENT DISPOSITION:  PACU - hemodynamically stable.   Delay start of Pharmacological VTE agent (>24hrs) due to surgical blood loss or risk of bleeding: not applicable  [phone]

## 2016-07-14 NOTE — Interval H&P Note (Signed)
History and Physical Interval Note:  07/14/2016 1:30 PM  Jesus FillJeffrey N Kasson  has presented today for surgery, with the diagnosis of right cubital tunnel syndrome  The various methods of treatment have been discussed with the patient and family. After consideration of risks, benefits and other options for treatment, the patient has consented to  Procedure(s): CUBITAL TUNNEL RELEASE (Right) as a surgical intervention .  The patient's history has been reviewed, patient examined, no change in status, stable for surgery.  I have reviewed the patient's chart and labs.  Questions were answered to the patient's satisfaction.     Fuller CanadaStanley Harrison

## 2016-07-14 NOTE — Brief Op Note (Signed)
07/14/2016  3:40 PM  PATIENT:  Jesus FillJeffrey N Caison  [age] y.o. male  PRE-OPERATIVE DIAGNOSIS:  right cubital tunnel syndrome  POST-OPERATIVE DIAGNOSIS:  right cubital tunnel syndrome  Findings tight cubital tunnel especially in the forearm area at the pronator and flexor pronator group  PROCEDURE:  Procedure(s): CUBITAL TUNNEL RELEASE (Right)  SURGEON:  Surgeon(s) and Role:    * Vickki HearingStanley E Zsazsa Bahena, MD - Primary  PHYSICIAN ASSISTANT:   ASSISTANTS: none   ANESTHESIA:   general   Postoperatively the patient had a decrease in his saturations had to ventilate him by hand is brought to the recovery room stable with O2 sat in the low to mid 90s and will be observed there   EBL:  Total I/O In: 1000 [I.V.:1000] Out: -   BLOOD ADMINISTERED:none  DRAINS: none   LOCAL MEDICATIONS USED:  MARCAINE     SPECIMEN:  No Specimen  DISPOSITION OF SPECIMEN:  N/A  COUNTS:  YES  TOURNIQUET:   Total Tourniquet Time Documented: Upper Arm (Right) - 20 minutes Total: Upper Arm (Right) - 20 minutes   DICTATION: .Dragon Dictation   Proper site marking and chart update was completed in the preop area. Right elbow marked for surgery. Patient taken to surgery 2 g of Ancef were given he was given general anesthesia with LMA  After sterile prep and drape timeout was completed  An incision was made just proximal to the medial epicondyle curved posterior to it and then into the pronator flexor group. Subcutaneous tissue was divided bluntly looking for the medial antebrachial cutaneous nerve which was not found.  We found the nerve proximally and then bluntly carried out dissection distally into the flexor pronator mass. We divided the muscles off the nerve and then dissected proximally to the medial intermuscular septum  Flexion-extension to show the nerve to be stable  Wound was irrigated and closed with 2-0 Monocryl and staples  20 mL of Marcaine with epinephrine injected  Sterile  dressing  At the time of extubation the patient dropped his O2 saturations and had to be ventilated until his saturations improved to the low to mid 90s.  He will be observed in the recovery area    PLAN OF CARE: Pending  PATIENT DISPOSITION:  PACU - hemodynamically stable.   Delay start of Pharmacological VTE agent (>24hrs) due to surgical blood loss or risk of bleeding: not applicable

## 2016-07-14 NOTE — Anesthesia Procedure Notes (Signed)
Procedure Name: LMA Insertion Date/Time: 07/14/2016 2:36 PM Performed by: Pernell DupreADAMS, Taishaun Levels A Pre-anesthesia Checklist: Patient identified, Timeout performed, Emergency Drugs available, Suction available and Patient being monitored Oxygen Delivery Method: Circle system utilized Intubation Type: IV induction Ventilation: Mask ventilation without difficulty LMA: LMA inserted LMA Size: 5.0 Number of attempts: 1 Placement Confirmation: positive ETCO2 and breath sounds checked- equal and bilateral Tube secured with: Tape Dental Injury: Teeth and Oropharynx as per pre-operative assessment

## 2016-07-14 NOTE — Discharge Instructions (Signed)
PATIENT INSTRUCTIONS °POST-ANESTHESIA ° °IMMEDIATELY FOLLOWING SURGERY:  Do not drive or operate machinery for the first twenty four hours after surgery.  Do not make any important decisions for twenty four hours after surgery or while taking narcotic pain medications or sedatives.  If you develop intractable nausea and vomiting or a severe headache please notify your doctor immediately. ° °FOLLOW-UP:  Please make an appointment with your surgeon as instructed. You do not need to follow up with anesthesia unless specifically instructed to do so. ° °WOUND CARE INSTRUCTIONS (if applicable):  Keep a dry clean dressing on the anesthesia/puncture wound site if there is drainage.  Once the wound has quit draining you may leave it open to air.  Generally you should leave the bandage intact for twenty four hours unless there is drainage.  If the epidural site drains for more than 36-48 hours please call the anesthesia department. ° °QUESTIONS?:  Please feel free to call your physician or the hospital operator if you have any questions, and they will be happy to assist you.    ° ° ° °Wound Care, Adult °Taking care of your wound properly can help to prevent pain and infection. It can also help your wound to heal more quickly. °How is this treated? °Wound care °· Follow instructions from your health care provider about how to take care of your wound. Make sure you: °? Wash your hands with soap and water before you change the bandage (dressing). If soap and water are not available, use hand sanitizer. °? Change your dressing as told by your health care provider. °? Leave stitches (sutures), skin glue, or adhesive strips in place. These skin closures may need to stay in place for 2 weeks or longer. If adhesive strip edges start to loosen and curl up, you may trim the loose edges. Do not remove adhesive strips completely unless your health care provider tells you to do that. °· Check your wound area every day for signs of  infection. Check for: °? More redness, swelling, or pain. °? More fluid or blood. °? Warmth. °? Pus or a bad smell. °· Ask your health care provider if you should clean the wound with mild soap and water. Doing this may include: °? Using a clean towel to pat the wound dry after cleaning it. Do not rub or scrub the wound. °? Applying a cream or ointment. Do this only as told by your health care provider. °? Covering the incision with a clean dressing. °· Ask your health care provider when you can leave the wound uncovered. °Medicines ° °· If you were prescribed an antibiotic medicine, cream, or ointment, take or use the antibiotic as told by your health care provider. Do not stop taking or using the antibiotic even if your condition improves. °· Take over-the-counter and prescription medicines only as told by your health care provider. If you were prescribed pain medicine, take it at least 30 minutes before doing any wound care or as told by your health care provider. °General instructions °· Return to your normal activities as told by your health care provider. Ask your health care provider what activities are safe. °· Do not scratch or pick at the wound. °· Keep all follow-up visits as told by your health care provider. This is important. °· Eat a diet that includes protein, vitamin A, vitamin C, and other nutrient-rich foods. These help the wound heal: °? Protein-rich foods include meat, dairy, beans, nuts, and other sources. °? Vitamin A-rich   foods include carrots and dark green, leafy vegetables. °? Vitamin C-rich foods include citrus, tomatoes, and other fruits and vegetables. °? Nutrient-rich foods have protein, carbohydrates, fat, vitamins, or minerals. Eat a variety of healthy foods including vegetables, fruits, and whole grains. °Contact a health care provider if: °· You received a tetanus shot and you have swelling, severe pain, redness, or bleeding at the injection site. °· Your pain is not controlled  with medicine. °· You have more redness, swelling, or pain around the wound. °· You have more fluid or blood coming from the wound. °· Your wound feels warm to the touch. °· You have pus or a bad smell coming from the wound. °· You have a fever or chills. °· You are nauseous or you vomit. °· You are dizzy. °Get help right away if: °· You have a red streak going away from your wound. °· The edges of the wound open up and separate. °· Your wound is bleeding and the bleeding does not stop with gentle pressure. °· You have a rash. °· You faint. °· You have trouble breathing. °This information is not intended to replace advice given to you by your health care provider. Make sure you discuss any questions you have with your health care provider. °Document Released: 05/10/2008 Document Revised: 03/30/2016 Document Reviewed: 02/16/2016 °Elsevier Interactive Patient Education © 2017 [employer] ° °

## 2016-07-15 ENCOUNTER — Encounter (HOSPITAL_COMMUNITY): Payer: Self-pay | Admitting: Orthopedic Surgery

## 2016-07-19 ENCOUNTER — Ambulatory Visit (INDEPENDENT_AMBULATORY_CARE_PROVIDER_SITE_OTHER): Payer: 59 | Admitting: Orthopedic Surgery

## 2016-07-19 ENCOUNTER — Encounter: Payer: Self-pay | Admitting: Orthopedic Surgery

## 2016-07-19 DIAGNOSIS — G5621 Lesion of ulnar nerve, right upper limb: Secondary | ICD-10-CM

## 2016-07-19 DIAGNOSIS — Z4889 Encounter for other specified surgical aftercare: Secondary | ICD-10-CM

## 2016-07-19 NOTE — Progress Notes (Signed)
Patient ID: Jesus Ingram, male   DOB: [birth year], 55 y.o.   MRN: [id number]  Post op visit   Chief Complaint  Patient presents with  . Follow-up    POST OP 1, RT CUBITAL TUNNEL RELEASE, DOS 07/14/16    In situ cubital tunnel release. Wound looks clean dry and intact. The patient's fingers just at the tips show little numbness there otherwise everything looks good come back in a week for staple removal  Out of work 6-8 weeks

## 2016-07-27 ENCOUNTER — Other Ambulatory Visit (HOSPITAL_COMMUNITY): Payer: Self-pay | Admitting: Pulmonary Disease

## 2016-07-27 ENCOUNTER — Ambulatory Visit (HOSPITAL_COMMUNITY)
Admission: RE | Admit: 2016-07-27 | Discharge: 2016-07-27 | Disposition: A | Discharge status: Home / Self Care | Payer: 59 | Source: Ambulatory Visit | Attending: Pulmonary Disease | Admitting: Pulmonary Disease

## 2016-07-27 ENCOUNTER — Ambulatory Visit (INDEPENDENT_AMBULATORY_CARE_PROVIDER_SITE_OTHER): Payer: 59 | Admitting: Orthopedic Surgery

## 2016-07-27 DIAGNOSIS — J690 Pneumonitis due to inhalation of food and vomit: Secondary | ICD-10-CM | POA: Insufficient documentation

## 2016-07-27 DIAGNOSIS — G5621 Lesion of ulnar nerve, right upper limb: Secondary | ICD-10-CM

## 2016-07-27 DIAGNOSIS — Z4889 Encounter for other specified surgical aftercare: Secondary | ICD-10-CM

## 2016-07-27 NOTE — Progress Notes (Signed)
Patient ID: Jesus Ingram, male   DOB: 30-May-1961, 55 y.o.   MRN: [id number]  Post op visit   Chief Complaint  Patient presents with  . Follow-up    Post op recheck on right cubital tunnel release, DOS 07-14-16.   HE STILL HAS NUMBNESS IN HIS FINGERS  BUT NOT HIS FOREARM  IT IS SPOTTY   THE WOUND HEALED NICELY AND THE STAPLES WERE TAKEN OUT   I WILL SEE HIM IN 4 WEEKS

## 2016-08-24 ENCOUNTER — Ambulatory Visit (INDEPENDENT_AMBULATORY_CARE_PROVIDER_SITE_OTHER): Payer: 59 | Admitting: Orthopedic Surgery

## 2016-08-24 ENCOUNTER — Encounter: Payer: Self-pay | Admitting: Orthopedic Surgery

## 2016-08-24 DIAGNOSIS — Z4889 Encounter for other specified surgical aftercare: Secondary | ICD-10-CM

## 2016-08-24 DIAGNOSIS — G5621 Lesion of ulnar nerve, right upper limb: Secondary | ICD-10-CM

## 2016-08-24 DIAGNOSIS — M75111 Incomplete rotator cuff tear or rupture of right shoulder, not specified as traumatic: Secondary | ICD-10-CM | POA: Diagnosis not present

## 2016-08-24 NOTE — Progress Notes (Signed)
Patient ID: Jesus Ingram, male   DOB: 1961/04/07, 56 y.o.   MRN: [id number]  Follow up visit/  POSTOP   Chief Complaint  Patient presents with  . Follow-up    LEFT CUBITAL TUNNEL RELEASE, DOS 07/14/17    POD #41  Status post cubital tunnel release  He still has some numbness in his small and ring finger from the wrist down. Incision is nontender. The forearm is nontender. There is no numbness or tingling in that area   Encounter Diagnoses  Name Primary?  . Tardy ulnar nerve palsy, right Yes  . Aftercare following surgery     Encounter Diagnoses  Name Primary?  . Tardy ulnar nerve palsy, right Yes  . Aftercare following surgery   . Incomplete tear of right rotator cuff   New problem  Pain right shoulder for over 3 months prior treatment includes injection and Advil and Aleve  Patient complains of weakness pain over the right shoulder decreased range of motion which is constant and worse with exercise  Review of systems status post right cubital tunnel release with residual numbness in the ring and small finger  Examination reveals tenderness over the right anterior deltoid with painful passive range of motion no instability weakness in the right rotator cuff supraspinatus compared to the left skin is normal pulses are normal sensation is normal and there is no lymphadenopathy in the right arm  Rotator cuff tear right shoulder failed nonoperative treatment of anti-inflammatories for over 6 weeks and subacromial injection  Recommend MRI to assess rotator cuff tear for surgery   11:37 AM Fuller CanadaStanley Eulamae Greenstein, MD 08/24/2016

## 2016-08-24 NOTE — Addendum Note (Signed)
Addended by: Adella HareBOOTHE, JAIME B on: 08/24/2016 11:56 AM   Modules accepted: Orders

## 2016-08-29 ENCOUNTER — Ambulatory Visit (HOSPITAL_COMMUNITY)
Admission: RE | Admit: 2016-08-29 | Discharge: 2016-08-29 | Disposition: A | Discharge status: Home / Self Care | Payer: 59 | Source: Ambulatory Visit | Attending: Orthopedic Surgery | Admitting: Orthopedic Surgery

## 2016-08-29 DIAGNOSIS — M75111 Incomplete rotator cuff tear or rupture of right shoulder, not specified as traumatic: Secondary | ICD-10-CM

## 2016-08-29 DIAGNOSIS — M25511 Pain in right shoulder: Secondary | ICD-10-CM | POA: Diagnosis not present

## 2016-08-29 DIAGNOSIS — E119 Type 2 diabetes mellitus without complications: Secondary | ICD-10-CM | POA: Diagnosis not present

## 2016-08-29 LAB — HEMOGLOBIN A1C: Hemoglobin A1C: 6.9

## 2016-09-02 ENCOUNTER — Ambulatory Visit (INDEPENDENT_AMBULATORY_CARE_PROVIDER_SITE_OTHER): Payer: 59 | Admitting: Orthopedic Surgery

## 2016-09-02 ENCOUNTER — Encounter: Payer: Self-pay | Admitting: Orthopedic Surgery

## 2016-09-02 DIAGNOSIS — M25511 Pain in right shoulder: Secondary | ICD-10-CM

## 2016-09-02 DIAGNOSIS — M75121 Complete rotator cuff tear or rupture of right shoulder, not specified as traumatic: Secondary | ICD-10-CM

## 2016-09-02 DIAGNOSIS — Z4889 Encounter for other specified surgical aftercare: Secondary | ICD-10-CM | POA: Diagnosis not present

## 2016-09-02 DIAGNOSIS — G5621 Lesion of ulnar nerve, right upper limb: Secondary | ICD-10-CM | POA: Diagnosis not present

## 2016-09-02 NOTE — Progress Notes (Signed)
Patient ID: Jesus FillJeffrey N Reust, male   DOB: March 03, 1961, 56 y.o.   MRN: [id number]   (Surgery for right cubital tunnel in situ release on 07/14/2016, a dressing follow-up on a new problem in the postop period) MRI FOLLOW UP  Chief Complaint  Patient presents with  . Follow-up    MRI RIGHT SHOULDER REVIEW  Right shoulder pain and weakness  HPI Jesus Ingram is a 56 y.o. male.    MRI OF THE Right shoulder for right shoulder pain  ROS  Physical Exam  Review note from 08/24/2016  Data  Independent image interpretation the MRI showed I do see the focal tear of the supraspinatus tendon. There is no other major changes noted on MRI  The report was read as follows   FINDINGS: Rotator cuff: There is a focal linear non retracted full-thickness tear of the distal supraspinous tendon. There is focal degeneration of the undersurface of the tendon proximal to the tear.Remainder of the rotator cuff is intact.   Muscles: No atrophy or abnormal signal of the muscles of the rotator cuff.   Biceps long head:  Properly located and intact.   Acromioclavicular Joint: Normal. Type 2 acromion. Fluid in the subacromial/subdeltoid bursa felt to be secondary to the full-thickness rotator cuff tear.   Glenohumeral Joint: Normal.   I've discussed this with the patient today I told him that we could repair this arthroscopically probably be out of work for 3 months and he would probably not get full recovery for one year. We also offered him further physical therapy he's already had injection.  He did not want to proceed therapy as he did this before on his left shoulder and didn't get good results  Who do an arthroscopic right shoulder rotator cuff repair with a speed screw  [name], MD 09/02/2016 10:07 AM

## 2016-09-08 ENCOUNTER — Telehealth: Payer: Self-pay | Admitting: Orthopedic Surgery

## 2016-09-08 DIAGNOSIS — K219 Gastro-esophageal reflux disease without esophagitis: Secondary | ICD-10-CM | POA: Diagnosis not present

## 2016-09-08 DIAGNOSIS — E119 Type 2 diabetes mellitus without complications: Secondary | ICD-10-CM | POA: Diagnosis not present

## 2016-09-08 DIAGNOSIS — M1990 Unspecified osteoarthritis, unspecified site: Secondary | ICD-10-CM | POA: Diagnosis not present

## 2016-09-08 NOTE — Telephone Encounter (Signed)
Patient called, then came by office regarding scheduling the surgery of right shoulder, as discussed at 09/02/16 office visit.  Please advise.  Patient's ph# [phone]

## 2016-09-12 NOTE — Telephone Encounter (Signed)
PLEASE ASK IF FEB 15 WOULD WORK FOR HIM?

## 2016-09-13 NOTE — Telephone Encounter (Signed)
Yes, patient states 09/29/16 is fine for surgery date.

## 2016-09-15 ENCOUNTER — Other Ambulatory Visit: Payer: Self-pay | Admitting: *Deleted

## 2016-09-15 NOTE — Telephone Encounter (Signed)
Surgery orders entered

## 2016-09-21 ENCOUNTER — Emergency Department (HOSPITAL_COMMUNITY)
Admission: EM | Admit: 2016-09-21 | Discharge: 2016-09-21 | Disposition: A | Discharge status: Home / Self Care | Payer: 59 | Attending: Internal Medicine | Admitting: Internal Medicine

## 2016-09-21 ENCOUNTER — Encounter (HOSPITAL_COMMUNITY): Payer: Self-pay | Admitting: Emergency Medicine

## 2016-09-21 ENCOUNTER — Emergency Department (HOSPITAL_COMMUNITY): Payer: 59

## 2016-09-21 DIAGNOSIS — R109 Unspecified abdominal pain: Secondary | ICD-10-CM

## 2016-09-21 DIAGNOSIS — Z79899 Other long term (current) drug therapy: Secondary | ICD-10-CM | POA: Insufficient documentation

## 2016-09-21 DIAGNOSIS — N23 Unspecified renal colic: Secondary | ICD-10-CM

## 2016-09-21 DIAGNOSIS — Z7984 Long term (current) use of oral hypoglycemic drugs: Secondary | ICD-10-CM | POA: Insufficient documentation

## 2016-09-21 DIAGNOSIS — E119 Type 2 diabetes mellitus without complications: Secondary | ICD-10-CM | POA: Diagnosis not present

## 2016-09-21 DIAGNOSIS — F17220 Nicotine dependence, chewing tobacco, uncomplicated: Secondary | ICD-10-CM | POA: Diagnosis not present

## 2016-09-21 DIAGNOSIS — N1330 Unspecified hydronephrosis: Secondary | ICD-10-CM | POA: Diagnosis not present

## 2016-09-21 DIAGNOSIS — N201 Calculus of ureter: Secondary | ICD-10-CM | POA: Insufficient documentation

## 2016-09-21 LAB — URINALYSIS, ROUTINE W REFLEX MICROSCOPIC
BILIRUBIN URINE: NEGATIVE
GLUCOSE, UA: NEGATIVE mg/dL
KETONES UR: NEGATIVE mg/dL
Leukocytes, UA: NEGATIVE
Nitrite: NEGATIVE
Specific Gravity, Urine: 1.025 (ref 1.005–1.030)
pH: 5.5 (ref 5.0–8.0)

## 2016-09-21 LAB — URINALYSIS, MICROSCOPIC (REFLEX)
Squamous Epithelial / LPF: NONE SEEN
WBC UA: NONE SEEN WBC/hpf (ref 0–5)

## 2016-09-21 MED ORDER — ONDANSETRON 4 MG PO TBDP: 4.0000 mg | ORAL_TABLET | Freq: Three times a day (TID) | ORAL | 0 refills | Status: DC | PRN

## 2016-09-21 MED ORDER — NAPROXEN 250 MG PO TABS: 250.0000 mg | ORAL_TABLET | Freq: Two times a day (BID) | ORAL | 0 refills | Status: DC

## 2016-09-21 MED ORDER — TAMSULOSIN HCL 0.4 MG PO CAPS: 0.4000 mg | ORAL_CAPSULE | Freq: Every day | ORAL | 0 refills | Status: DC

## 2016-09-21 MED ORDER — ONDANSETRON HCL 4 MG/2ML IJ SOLN
4.0000 mg | INTRAMUSCULAR | Status: AC | PRN
  Administered 2016-09-21 (×2): 4 mg via INTRAVENOUS
  Filled 2016-09-21 (×2): qty 2

## 2016-09-21 MED ORDER — MORPHINE SULFATE (PF) 4 MG/ML IV SOLN
4.0000 mg | INTRAVENOUS | Status: AC | PRN
  Administered 2016-09-21 (×2): 4 mg via INTRAVENOUS
  Filled 2016-09-21 (×2): qty 1

## 2016-09-21 MED ORDER — HYDROCODONE-ACETAMINOPHEN 5-325 MG PO TABS: ORAL_TABLET | ORAL | 0 refills | Status: DC

## 2016-09-21 NOTE — ED Triage Notes (Signed)
PT c/o left sided flank pain that started x3 days. PT denies any urinary symptoms but states darker colored urine noted the past few days. PT states hx of kidney stones.

## 2016-09-21 NOTE — ED Notes (Signed)
Pt back from CT

## 2016-09-21 NOTE — Discharge Instructions (Signed)
Take the prescriptions as directed. Call the Urologist today to schedule a follow up appointment this week.  Return to the Emergency Department immediately if worsening. ° °

## 2016-09-21 NOTE — ED Provider Notes (Signed)
[facility] Provider Note   CSN: [id number] Arrival date & time: 09/21/16  0813     History   Chief Complaint Chief Complaint  Patient presents with  . Flank Pain    HPI Jesus Ingram is a 56 y.o. male.  HPI  Pt was seen at 0830. Per pt, c/o sudden onset and persistence of constant left sided flank "pain" for the past 3 days.  Pt describes the pain as "like my last kidney stone," and "aching." Has been associated with "dark urine."  Denies testicular pain/swelling, no dysuria/hematuria, no abd pain, no vomiting/diarrhea, no CP/SOB, no fevers, no rash.    Past Medical History:  Diagnosis Date  . Arthritis   . Borderline diabetes   . Chronic back pain   . Diabetes mellitus without complication (HCC)   . GERD (gastroesophageal reflux disease)   . History of kidney stones    history of kidney stones.  . History of renal calculi   . Reflux    occasional, uses TUMS  . Restless leg syndrome   . Ruptured disc, thoracic   . Seasonal allergies   . Sleep apnea    lost weight and has gotten better    Patient Active Problem List   Diagnosis Date Noted  . Cubital tunnel syndrome on right     Past Surgical History:  Procedure Laterality Date  . CATARACT EXTRACTION W/PHACO  01/10/2012   Procedure: CATARACT EXTRACTION PHACO AND INTRAOCULAR LENS PLACEMENT (IOC);  Surgeon: Loraine Leriche T. Nile Riggs, MD;  Location: [facility];  Service: Ophthalmology;  Laterality: Right;  CDE 9.77  . CATARACT EXTRACTION W/PHACO Left 01/13/2015   Procedure: CATARACT EXTRACTION PHACO AND INTRAOCULAR LENS PLACEMENT (IOC);  Surgeon: Jethro Bolus, MD;  Location: [facility];  Service: Ophthalmology;  Laterality: Left;  CDE:3.08  . COLONOSCOPY N/A 04/25/2013   Procedure: COLONOSCOPY;  Surgeon: Malissa Hippo, MD;  Location: [facility];  Service: Endoscopy;  Laterality: N/A;  830  . CYSTOSCOPY/RETROGRADE/URETEROSCOPY/STONE EXTRACTION WITH BASKET  2011   with stents, [facility]; [place]  . ELBOW ARTHROSCOPY W/  SYNOVECTOMY    . LITHOTRIPSY    . ULNAR TUNNEL RELEASE Right 07/14/2016   Procedure: CUBITAL TUNNEL RELEASE;  Surgeon: Vickki Hearing, MD;  Location: [facility];  Service: Orthopedics;  Laterality: Right;  . WISDOM TOOTH EXTRACTION         Home Medications    Prior to Admission medications   Medication Sig Start Date End Date Taking? Authorizing Provider  calcium carbonate (TUMS - DOSED IN MG ELEMENTAL CALCIUM) 500 MG chewable tablet Chew 1 tablet by mouth daily as needed for indigestion or heartburn.    Historical Provider, MD  metFORMIN (GLUCOPHAGE) 500 MG tablet Take 500 mg by mouth every evening.  01/18/16   Historical Provider, MD  nabumetone (RELAFEN) 750 MG tablet Take 750 mg by mouth daily as needed for mild pain.     Historical Provider, MD  rOPINIRole (REQUIP) 0.5 MG tablet Take 0.5 mg by mouth daily as needed (restless legs).     Historical Provider, MD    Family History Family History  Problem Relation Age of Onset  . Colon cancer Neg Hx     Social History Social History  Substance Use Topics  . Smoking status: Never Smoker  . Smokeless tobacco: Current User    Types: Chew  . Alcohol use No     Allergies   Patient has no known allergies.   Review of Systems Review of Systems ROS: Statement:  All systems negative except as marked or noted in the HPI; Constitutional: Negative for fever and chills. ; ; Eyes: Negative for eye pain, redness and discharge. ; ; ENMT: Negative for ear pain, hoarseness, nasal congestion, sinus pressure and sore throat. ; ; Cardiovascular: Negative for chest pain, palpitations, diaphoresis, dyspnea and peripheral edema. ; ; Respiratory: Negative for cough, wheezing and stridor. ; ; Gastrointestinal: Negative for nausea, vomiting, diarrhea, abdominal pain, blood in stool, hematemesis, jaundice and rectal bleeding. . ; ; Genitourinary: +left flank pain. Negative for dysuria and hematuria. ; ; Genital:  No penile drainage or rash, no testicular  pain or swelling, no scrotal rash or swelling. ;; Musculoskeletal: Negative for back pain and neck pain. Negative for swelling and trauma.; ; Skin: Negative for pruritus, rash, abrasions, blisters, bruising and skin lesion.; ; Neuro: Negative for headache, lightheadedness and neck stiffness. Negative for weakness, altered level of consciousness, altered mental status, extremity weakness, paresthesias, involuntary movement, seizure and syncope.       Physical Exam Updated Vital Signs BP 139/91   Pulse 84   Temp 98.5 F (36.9 C) (Oral)   Resp 18   Ht 6' (1.829 m)   Wt 235 lb (106.6 kg)   SpO2 96%   BMI 31.87 kg/m   Physical Exam 0835: Physical examination:  Nursing notes reviewed; Vital signs and O2 SAT reviewed;  Constitutional: Well developed, Well nourished, Well hydrated, In no acute distress; Head:  Normocephalic, atraumatic; Eyes: EOMI, PERRL, No scleral icterus; ENMT: Mouth and pharynx normal, Mucous membranes moist; Neck: Supple, Full range of motion, No lymphadenopathy; Cardiovascular: Regular rate and rhythm, No gallop; Respiratory: Breath sounds clear & equal bilaterally, No wheezes.  Speaking full sentences with ease, Normal respiratory effort/excursion; Chest: Nontender, Movement normal; Abdomen: Soft, Nontender, Nondistended, Normal bowel sounds; Genitourinary: No CVA tenderness; Spine:  No midline CS, TS, LS tenderness.;; Extremities: Pulses normal, No tenderness, No edema, No calf edema or asymmetry.; Neuro: AA&Ox3, Major CN grossly intact.  Speech clear. No gross focal motor or sensory deficits in extremities.; Skin: Color normal, Warm, Dry.   ED Treatments / Results  Labs (all labs ordered are listed, but only abnormal results are displayed)   EKG  EKG Interpretation None       Radiology   Procedures Procedures (including critical care time)  Medications Ordered in ED Medications  morphine 4 MG/ML injection 4 mg (4 mg Intravenous Given 09/21/16 0844)    ondansetron (ZOFRAN) injection 4 mg (4 mg Intravenous Given 09/21/16 0844)     Initial Impression / Assessment and Plan / ED Course  I have reviewed the triage vital signs and the nursing notes.  Pertinent labs & imaging results that were available during my care of the patient were reviewed by me and considered in my medical decision making (see chart for details).  MDM Reviewed: previous chart, nursing note and vitals Reviewed previous: labs Interpretation: labs, ultrasound and CT scan   Results for orders placed or performed during the hospital encounter of 09/21/16  Urinalysis, Routine w reflex microscopic  Result Value Ref Range   Color, Urine YELLOW YELLOW   APPearance CLEAR CLEAR   Specific Gravity, Urine 1.025 1.005 - 1.030   pH 5.5 5.0 - 8.0   Glucose, UA NEGATIVE NEGATIVE mg/dL   Hgb urine dipstick SMALL (A) NEGATIVE   Bilirubin Urine NEGATIVE NEGATIVE   Ketones, ur NEGATIVE NEGATIVE mg/dL   Protein, ur TRACE (A) NEGATIVE mg/dL   Nitrite NEGATIVE NEGATIVE   Leukocytes, UA  NEGATIVE NEGATIVE  Urinalysis, Microscopic (reflex)  Result Value Ref Range   RBC / HPF 0-5 0 - 5 RBC/hpf   WBC, UA NONE SEEN 0 - 5 WBC/hpf   Bacteria, UA RARE (A) NONE SEEN   Squamous Epithelial / LPF NONE SEEN NONE SEEN   [place] Renal Result Date: 09/21/2016 CLINICAL DATA:  Left flank pain for 3 days. EXAM: RENAL / URINARY TRACT ULTRASOUND COMPLETE COMPARISON:  CT abdomen and pelvis 07/16/2008 FINDINGS: Right Kidney: Length: 12.0 cm. Echogenicity within normal limits. No mass or hydronephrosis visualized. Left Kidney: Length: 12.6 cm. Echogenicity within normal limits. No mass visualize. Mild calyceal fullness. Bladder: Decompressed and not well visualized. IMPRESSION: Minimal left hydronephrosis. Electronically Signed   By: Sebastian AcheAllen  Grady M.D.   On: 09/21/2016 09:18    Ct Renal Stone Study Result Date: 09/21/2016 CLINICAL DATA:  Left flank pain for 3 days EXAM: CT ABDOMEN AND PELVIS WITHOUT CONTRAST  TECHNIQUE: Multidetector CT imaging of the abdomen and pelvis was performed following the standard protocol without IV contrast. COMPARISON:  Ultrasound of the abdomen of 09/21/2016 and CT abdomen pelvis of 07/16/2008 FINDINGS: Lower chest:  The lung bases clear. Hepatobiliary: The liver is unremarkable the unenhanced state. The gallbladder is somewhat distended but no gall stones or gallbladder wall thickening is noted. Pancreas: The pancreas is normal in size and the pancreatic duct is not dilated. Spleen: The spleen is unremarkable. Adrenals/Urinary Tract: The adrenal glands appear normal. Small bilateral renal calculi are present. There is moderate left hydronephrosis present 2 point of partial obstruction by a 5 x 8 mm proximal left ureteral calculus just caudal to the left UV junction. The more distal ureters are normal in caliber. The urinary bladder is not well distended but no abnormality is seen. Stomach/Bowel: The stomach is not well distended. No small bowel distention is noted. The colon is somewhat elongated and tortuous. The terminal ileum and the appendix are both unremarkable. Vascular/Lymphatic: The abdominal aorta is normal in caliber. No adenopathy is seen. Reproductive: The prostate is normal in size. Other: None Musculoskeletal: The lumbar vertebrae are in normal alignment with mild degenerative disc disease at L5-S1. The SI joints are corticated. IMPRESSION: 1. Moderate left hydronephrosis caused by a 5 x 8 mm proximal left ureteral calculus. 2. Multiple small bilateral renal calculi. Electronically Signed   By: Dwyane DeePaul  Barry M.D.   On: 09/21/2016 11:55    1245:  Pt feels better after meds and wants to go home now. Tx symptomatically, f/u Uro MD. Dx and testing d/w pt and family.  Questions answered.  Verb understanding, agreeable to d/c home with outpt f/u.   Final Clinical Impressions(s) / ED Diagnoses   Final diagnoses:  None    New Prescriptions New Prescriptions   No  medications on file     Samuel JesterKathleen Kelsen Celona, DO 09/24/16 2135

## 2016-09-22 LAB — URINE CULTURE: CULTURE: NO GROWTH

## 2016-09-22 NOTE — Patient Instructions (Signed)
Jesus Ingram  09/22/2016     @PREFPERIOPPHARMACY @   Your procedure is scheduled on  09/29/2016  Report to [facility] at  945  A.M.  Call this number if you have problems the morning of surgery:  [phone]   Remember:  Do not eat food or drink liquids after midnight.  Take these medicines the morning of surgery with A SIP OF WATER  Tylenol  #3 or hydrocodone, zofran, requip, flomax.   Do not wear jewelry, make-up or nail polish.  Do not wear lotions, powders, or perfumes, or deoderant.  Do not shave 48 hours prior to surgery.  Men may shave face and neck.  Do not bring valuables to the hospital.  [facility] is not responsible for any belongings or valuables.  Contacts, dentures or bridgework may not be worn into surgery.  Leave your suitcase in the car.  After surgery it may be brought to your room.  For patients admitted to the hospital, discharge time will be determined by your treatment team.  Patients discharged the day of surgery will not be allowed to drive home.   Name and phone number of your driver:   family Special instructions:  None  Please read over the following fact sheets that you were given. Anesthesia Post-op Instructions and Care and Recovery After Surgery       Surgery for Impingement Syndrome, Subacromial Decompression Subacromial decompression surgery is for patients with rotator cuff tendinitis, subacromial bursitis (inflamed, fluid-filled sac between the shoulder joint and top of the shoulder blade), or impingement syndrome (inflamed rotator cuff tendons due to pinching). Surgery is for patients with continued shoulder pain despite at least 3 months of rehabilitation treatment. The shoulder pain is so severe that it affects patients' daily activities or greatly decreases their quality of life. Patients who will benefit most from surgery are those whose shoulder bone (acromion) has a curve, hook, or bump (spur), or those who  have a partial rotator cuff tear. There are 3 purposes of surgery. First, the inflamed bursa is removed. Second, the shoulder bone defect (curve, hook, spur) is removed. Third, the coracoacromial ligament is cut. This surgery is intended to reduce pain by increasing space in the area so that the rotator cuff is less likely to be pinched. REASONS NOT TO OPERATE   Infection of the shoulder joint.  Patient is unable or unwilling to complete the postoperative program. This includes keeping the shoulder in a sling or immobilizer (if open surgery is performed), or performing the needed rehabilitation.  Emotional or psychological conditions that contribute to the shoulder condition.  Patients who have rotator cuff inflammation due to other causes. This includes impingement caused by shoulder instability, weak shoulder blade muscles (scapula), shoulder arthritis, stiff or frozen shoulder, or a large os acromiale (failure of the shoulder bone growth plates to fuse properly). RISKS AND COMPLICATIONS   Infection.  Bleeding.  Injury to nerves (numbness, weakness, paralysis).  Continued or recurring pain.  Detachment of the deltoid shoulder muscle (if open surgery is performed).  Stiffness or loss of shoulder motion.  Decrease in athletic performance.  Shoulder weakness.  Fracture of the shoulder bone.  Pain in the joint connecting the shoulder bone and collarbone.  Removal of too much or too little shoulder bone. TECHNIQUE Technique used may vary between surgeons. In general, the surgery is performed with a flexible tube and tools inserted in a few small slits  near the joint (arthroscopic). It may also be completed through an open cut (incision). The goal of the procedure is to remove the bursa, remove the shoulder bone deformity, and cut the coracoacromial ligament. Electricity will be used to sear the small capillaries (cauterize) to stop small amounts of bleeding. Other tools used are an  electric or motorized shaver to remove the bursa, and a small power drill (burr) to remove the deformity of the shoulder bone.  If the procedure is completed with an open incision, the surgeon will detach the deltoid shoulder muscle from the shoulder bone and cut the coracoacromial ligament. The deformity of the shoulder bone is then removed, using a saw or chisel (osteotome). A file (rasp) may be used to smooth the edges. Finally, the bursa is removed with scissors, and the deltoid muscle is reattached to the shoulder bone.  HOME CARE INSTRUCTIONS   Postoperative care depends on the surgical technique used (arthroscopic or open).  Follow the instructions given to you by your surgeon.  Keep the wound clean and dry for 10 to 14 days after surgery, especially if open surgery is performed.  Wear a sling, brace, or immobilizer as prescribed by your surgeon. This often lasts a couple days for arthroscopic procedures, or 6 to 8 weeks for open procedures, because the deltoid muscle must heal.  You will be given pain medicines by your caregiver or surgeon. Take only as much medicine as you need.  You may be advised to perform motion exercises immediately after surgery. These may be performed at home or with a therapist.  Postoperative rehabilitation and exercises are very important to regain motion, and later, strength. RETURN TO SPORTS   6 weeks is the minimum waiting time required before returning to play. Open procedure surgeries are often longer.  Return to sports depends on the type of sport and the position played.  A therapist must assess your strength and range of motion before athletics may be resumed. SEEK MEDICAL CARE IF:   You experience pain, numbness, or coldness in the hand.  Blue, gray, or dark color appears in the fingernails.  Any of the following occur after surgery:  Increased pain, swelling, redness, drainage of fluids, or bleeding in the affected area.  Signs of  infection (headache, muscle aches, dizziness, a general ill feeling with fever).  New, unexplained symptoms develop. (Drugs used in treatment may produce side effects.) Do not eat or drink anything before surgery. Solid food makes general anesthesia more hazardous.  This information is not intended to replace advice given to you by your health care provider. Make sure you discuss any questions you have with your health care provider. Document Released: 08/01/2005 Document Revised: 08/22/2014 Document Reviewed: 07/18/2015 Elsevier Interactive Patient Education  2017 [employer]  Surgery for Shoulder Impingement Syndrome, Care After Introduction Refer to this sheet in the next few weeks. These instructions provide you with information about caring for yourself after your procedure. Your health care provider may also give you more specific instructions. Your treatment has been planned according to current medical practices, but problems sometimes occur. Call your health care provider if you have any problems or questions after your procedure. What can I expect after the procedure? After your procedure, it is common to have pain and stiffness in your shoulder area. This may include your arm, chest, and back. Follow these instructions at home: If you have a sling:  Wear the sling as told by your health care provider. Remove it only  as told by your health care provider.  Loosen the sling if your fingers tingle, become numb, or turn cold and blue.  Do not let your sling get wet if it is not waterproof.  Keep the sling clean. Bathing  Do not take baths, swim, or use a hot tub until your health care provider approves. Ask your health care provider if you can take showers. You may only be allowed to take sponge baths for bathing.  If you have a sling that is not waterproof, cover it with a watertight covering when you take a bath or a shower.  Keep your bandage (dressing) dry until your health  care provider says it can be removed. Incision care  Check your incision area every day for signs of infection. Check for:  More redness, swelling, or pain.  More fluid or blood.  Warmth.  Pus or a bad smell.  Follow instructions from your health care provider about how to take care of your incision. Make sure you:  Wash your hands with soap and water before you change your bandage (dressing). If soap and water are not available, use hand sanitizer.  Change your dressing as told by your health care provider.  Leave stitches (sutures), skin glue, or adhesive strips in place. These skin closures may need to be in place for 2 weeks or longer. If adhesive strip edges start to loosen and curl up, you may trim the loose edges. Do not remove adhesive strips completely unless your health care provider tells you to do that. Driving  Do not drive or operate heavy machinery while taking prescription pain medicine.  Do not drive for 24 hours if you received a sedative.  Ask your health care provider when it is safe to drive if you have a sling on your arm. Managing pain, stiffness, and swelling  Move your fingers often to avoid stiffness and to lessen swelling.  Keep your arm and shoulder in the position recommended by your health care provider.  If directed, put ice on your shoulder.  Put ice in a plastic bag.  Place a towel between your skin and the bag.  Leave the ice on for 20 minutes, 2-3 times a day. Activity  Return to your normal activities as told by your health care provider. Ask your health care provider what activities are safe for you.  Do not lift anything that is heavier than 10 lb (4.5 kg) until your health care provider tells you that it is safe.  Do exercises and stretches as told by your health care provider. General instructions  Take over-the-counter and prescription medicines only as told by your health care provider.  Do not use any tobacco products, such  as cigarettes, chewing tobacco, and e-cigarettes. If you need help quitting, ask your health care provider.  Keep all follow-up visits as told by your health care provider. This is important. Contact a health care provider if:  You have a fever.  You have pain that gets worse or does not get better with medicine.  You have more redness, swelling, or pain around your incision.  You have more fluid or blood coming from your incision.  Your incision feels warm to the touch.  You have pus or a bad smell coming from your incision.  You have muscle aches.  You are dizzy. Get help right away if:  You have severe pain in your shoulder, arm, or hand.  Your hand or arm becomes numb.  Your hand or  arm feels unusually cold.  Your fingernails turn a dark color, such as blue or gray. This information is not intended to replace advice given to you by your health care provider. Make sure you discuss any questions you have with your health care provider. Document Released: 11/23/2015 Document Revised: 01/07/2016 Document Reviewed: 07/18/2015  2017 Elsevier  Shoulder Arthroscopy Shoulder arthroscopy is a surgical technique used to evaluate and treat injuries involving the shoulder joint. In this technique, small cuts (incisions) are made in your shoulder. A small, telescope-like instrument with a lighted camera on one end (arthroscope) and surgical instruments are inserted through these incisions into your shoulder joint. This allows your health care provider to look directly into the joint and repair any damage at the same time. Tell a health care provider about:  Any allergies you have.  All medicines you are taking, including vitamins, herbs, eye drops, creams, and over-the-counter medicines.  Any problems you or family members have had with anesthetic medicines.  Any blood disorders you have.  Any surgeries you have had.  Any medical conditions you have. What are the  risks? Generally, this is a safe procedure. However, problems can occur and include:  Damage to nerves or blood vessels.  Excess bleeding.  Blood clots.  Infection. What happens before the procedure?  Ask your health care provider about:  Changing or stopping your regular medicines. This is especially important if you are taking diabetes medicines or blood thinners.  Taking medicines such as aspirin and ibuprofen. These medicines can thin your blood. Do not take these medicines before your procedure if your health care provider asks you not to.  Do not eat or drink anything after midnight on the night before the procedure or as directed by your health care provider.  You may have an exam or testing. What happens during the procedure?  You may be given a medicine to make you sleep (general anesthetic) or a medicine to numb the shoulder area (local anesthetic).  Several small incisions will be made in your shoulder. Saline fluid will be put in through one of the incisions to expand the joint space so your health care provider can see the area more easily.  An arthroscope will be inserted into one of the incisions. The arthroscope sends an image to a TV screen so the health care provider can examine your shoulder joint.  During the procedure, your health care provider may find various problems.  Tools may be inserted through the other incisions to repair any injuries found. In some cases, the procedure may change to an open surgery if problems are found that cannot be repaired with arthroscopy.  The incisions will then be closed with stitches or tape. What happens after the procedure? You will be taken to a recovery area where your progress will be watched. This information is not intended to replace advice given to you by your health care provider. Make sure you discuss any questions you have with your health care provider. Document Released: 02/26/2014 Document Revised: 03/27/2016  Document Reviewed: 09/20/2013 Elsevier Interactive Patient Education  2017 [employer] Shoulder Arthroscopy, Care After Refer to this sheet in the next few weeks. These instructions provide you with information on caring for yourself after your procedure. Your health care provider may also give you more specific instructions. Your treatment has been planned according to current medical practices, but problems sometimes occur. Call your health care provider if you have any problems or questions after your procedure. What can  I expect after the procedure? After your procedure, it is typical to have the following:  Pain at the site of the surgical cuts (incisions).  Stiffness in your shoulder. This should gradually decrease over time. Your health care provider may recommend physical therapy to help improve this.  Nausea, vomiting, or constipation. These symptoms can result from taking pain medicine after surgery.  Clear or red drainage from the incision sites. This is normal for a few days after surgery.  Fatigue. Follow these instructions at home:  Take medicines only as directed by your health care provider.  Use a sling as directed.  There are many different ways to close and cover an incision, including stitches, skin glue, and adhesive strips. Follow your health care provider's instructions on:  Incision care.  Bandage (dressing) changes and removal.  Incision closure removal.  Apply ice to the injured area:  Put ice in a plastic bag.  Place a towel between your skin and the bag.  Leave the ice on for 20 minutes, 2-3 times a day.  If physical therapy and exercises are prescribed by your health care provider, do them as directed.  Keep all follow-up visits as directed by your health care provider. This is important. Contact a health care provider if: You have a fever. Get help right away if:  You have drainage, redness, swelling, or increasing pain at the incision  site.  You notice a bad smell coming from the incision site or dressing.  Your incision site breaks open after your stitches or tape has been removed. This information is not intended to replace advice given to you by your health care provider. Make sure you discuss any questions you have with your health care provider. Document Released: 02/26/2014 Document Revised: 03/27/2016 Document Reviewed: 09/20/2013 Elsevier Interactive Patient Education  2017 [employer] PATIENT INSTRUCTIONS POST-ANESTHESIA  IMMEDIATELY FOLLOWING SURGERY:  Do not drive or operate machinery for the first twenty four hours after surgery.  Do not make any important decisions for twenty four hours after surgery or while taking narcotic pain medications or sedatives.  If you develop intractable nausea and vomiting or a severe headache please notify your doctor immediately.  FOLLOW-UP:  Please make an appointment with your surgeon as instructed. You do not need to follow up with anesthesia unless specifically instructed to do so.  WOUND CARE INSTRUCTIONS (if applicable):  Keep a dry clean dressing on the anesthesia/puncture wound site if there is drainage.  Once the wound has quit draining you may leave it open to air.  Generally you should leave the bandage intact for twenty four hours unless there is drainage.  If the epidural site drains for more than 36-48 hours please call the anesthesia department.  QUESTIONS?:  Please feel free to call your physician or the hospital operator if you have any questions, and they will be happy to assist you.

## 2016-09-26 ENCOUNTER — Telehealth: Payer: Self-pay | Admitting: Orthopedic Surgery

## 2016-09-26 ENCOUNTER — Encounter (HOSPITAL_COMMUNITY)
Admission: RE | Admit: 2016-09-26 | Discharge: 2016-09-26 | Disposition: A | Discharge status: Home / Self Care | Payer: 59 | Source: Ambulatory Visit | Attending: Orthopedic Surgery | Admitting: Orthopedic Surgery

## 2016-09-26 NOTE — Telephone Encounter (Signed)
Mr. Jesus Ingram came by this morning before going over for his pre operative appointment stating he was having kidney stones.  He states he has had these many times before.  He said he was having some other medical issues and wanted to speak to Dr. Romeo AppleHarrison or our nurse.  We explained to him that unfortunately, neither was in the office today.  He said he was going on over to the pre op appointment anyway and "see what they had to say".  Trey PaulaJeff came back by and said that he did not have the pre op done and that he guessed for now surgery needed to be put on hold.  He asks that our nurse give him a call to let him know how to proceed.  Fyi, I have not canceled any appointments for this patient myself.  Please call him  Thanks

## 2016-09-27 NOTE — Telephone Encounter (Signed)
Moving surgery date to March 22

## 2016-09-29 ENCOUNTER — Ambulatory Visit (HOSPITAL_COMMUNITY): Admission: RE | Admit: 2016-09-29 | Payer: 59 | Source: Ambulatory Visit | Admitting: Orthopedic Surgery

## 2016-09-29 ENCOUNTER — Encounter (HOSPITAL_COMMUNITY): Admission: RE | Payer: Self-pay | Source: Ambulatory Visit

## 2016-09-29 SURGERY — ARTHROSCOPY, SHOULDER, WITH ROTATOR CUFF REPAIR
Anesthesia: Choice | Laterality: Right

## 2016-10-04 ENCOUNTER — Ambulatory Visit: Payer: 59 | Admitting: Orthopedic Surgery

## 2016-10-04 ENCOUNTER — Encounter: Payer: Self-pay | Admitting: Orthopedic Surgery

## 2016-10-04 ENCOUNTER — Ambulatory Visit (INDEPENDENT_AMBULATORY_CARE_PROVIDER_SITE_OTHER): Payer: 59 | Admitting: Orthopedic Surgery

## 2016-10-04 DIAGNOSIS — M75121 Complete rotator cuff tear or rupture of right shoulder, not specified as traumatic: Secondary | ICD-10-CM | POA: Diagnosis not present

## 2016-10-04 NOTE — Progress Notes (Signed)
FOLLOW UP VISIT   Patient ID: Jesus FillJeffrey N Ingram, male   DOB: February 15, 1961, 56 y.o.   MRN: [id number]  Chief Complaint  Patient presents with  . Follow-up    TO RESCHEDULE ROTATOR CUFF SURGERY, DOS 09-29-16.    HPI Jesus Ingram is a 56 y.o. male.   HPI  Jesus Ingram scheduled for rotator cuff surgery but had kidney stones we had to postpone it any was down with a stomach virus presents back to reschedule his surgery  Review of Systems Review of Systems  The patient has had resolution of ulnar nerve symptoms   Physical Exam  SEE prior exam findings    MEDICAL DECISION MAKING  DATA    DIAGNOSIS ROTATOR CUFF TEAR RIGHT SHOULDERAN(RISK)  Encounter Diagnosis  Name Primary?  . Complete tear of right rotator cuff Yes     PLAN Arthroscopic rotator cuff repair

## 2016-10-12 ENCOUNTER — Encounter: Payer: Self-pay | Admitting: *Deleted

## 2016-10-12 NOTE — Progress Notes (Signed)
SURGICAL PRE AUTHORIZATION APPROVED FOR CPT [phone], OUTPATIENT  REFERENCE [id number]

## 2016-10-26 NOTE — Patient Instructions (Signed)
Jesus Ingram  10/26/2016     @PREFPERIOPPHARMACY @   Your procedure is scheduled on  11/03/2016 .  Report to [name] at  615  A.M.  Call this number if you have problems the morning of surgery:  [phone]   Remember:  Do not eat food or drink liquids after midnight.  Take these medicines the morning of surgery with A SIP OF WATER  Tylenol #3 or norco, relafen, zofran, requip, flomax.   Do not wear jewelry, make-up or nail polish.  Do not wear lotions, powders, or perfumes, or deoderant.  Do not shave 48 hours prior to surgery.  Men may shave face and neck.  Do not bring valuables to the hospital.  [facility] is not responsible for any belongings or valuables.  Contacts, dentures or bridgework may not be worn into surgery.  Leave your suitcase in the car.  After surgery it may be brought to your room.  For patients admitted to the hospital, discharge time will be determined by your treatment team.  Patients discharged the day of surgery will not be allowed to drive home.   Name and phone number of your driver:   family Special instructions:  None  Please read over the following fact sheets that you were given. Anesthesia Post-op Instructions and Care and Recovery After Surgery       Surgery for Rotator Cuff Tear The rotator cuff is a group of muscles and connective tissues (tendons) that surround the shoulder joint and keep the upper arm bone (humerus) in the shoulder socket. A tendon is the place on a muscle where it attaches to a bone. Surgery may be done to repair a partial or complete tear in the rotator cuff that cannot be treated by nonsurgical methods. The exact procedure that you have depends on your injury. If you have a partial tear, you may have surgery to reattach a tendon to the humerus. If you have a complete tear, you may have surgery to sew the two sides of the tear back together. Surgery may be done through small incisions using an  operating telescope (arthroscope), through a larger (open) incision, or through a combination of both. Tell a health care provider about:  Any allergies you have.  All medicines you are taking, including vitamins, herbs, eye drops, creams, and over-the-counter medicines.  Any problems you or family members have had with anesthetic medicines.  Any blood disorders you have.  Any surgeries you have had.  Any medical conditions you have.  Whether you are pregnant or may be pregnant. What are the risks? Generally, this is a safe procedure. However, problems may occur, including:  Infection.  Bleeding.  Allergic reactions to medicines or materials used during the procedure.  Damage to nerves, blood vessels, or shoulder muscles.  Permanent loss of full shoulder movement (stiffness). What happens before the procedure? Staying hydrated  Follow instructions from your health care provider about hydration, which may include:  Up to 2 hours before the procedure - you may continue to drink clear liquids, such as water, clear fruit juice, black coffee, and plain tea. Eating and drinking restrictions  Follow instructions from your health care provider about eating and drinking, which may include:  8 hours before the procedure - stop eating heavy meals or foods such as meat, fried foods, or fatty foods.  6 hours before the procedure - stop eating light meals or foods, such as toast  or cereal.  6 hours before the procedure - stop drinking milk or drinks that contain milk.  2 hours before the procedure - stop drinking clear liquids. Other instructions   Ask your health care provider about:  Changing or stopping your regular medicines. This is especially important if you are taking diabetes medicines or blood thinners.  Taking medicines such as aspirin and ibuprofen. These medicines can thin your blood. Do not take these medicines before your procedure if your health care provider  instructs you not to.  Plan to have someone take you home from the hospital or clinic.  If you will be going home right after the procedure, plan to have someone with you for 24 hours.  Ask your health care provider how your surgical site will be marked or identified.  You may be given antibiotic medicine to help prevent infection. What happens during the procedure?  To reduce your risk of infection:  Your health care team will wash or sanitize their hands.  Your skin will be washed with soap.  An IV tube will be inserted into one of your veins.  You will be given one or more of the following:  A medicine to help you relax (sedative).  A medicine to make you fall asleep (general anesthetic).  A medicine that is injected into an area of your body to numb everything beyond the injection site (regional anesthetic).  Your surgeon will move your shoulder to observe your injury.  If you are having arthroscopic surgery:  Small incisions will be made in the front and back of your shoulder.  An arthroscope will be inserted through these incisions to examine the inside of your shoulder and plan the surgery.  If you are having open surgery, a wider incision will be made in your shoulder.  Some of the muscle covering your shoulder (deltoid) may be moved to expose your rotator cuff.  Bony growths that might interfere with healing will be removed.  Your rotator cuff will be trimmed around the area where it has torn away from your humerus.  If your rotator cuff is completely torn, the split ends will be sewn back together.  Anchoring inserts will be placed into your humerus in the area where the tendon has torn away from the bone.  The torn end of your rotator cuff will be re-attached (anchored) to your humerus using stitches and small screws.  Your incisions will be closed with sutures.  The incision in your skin will be covered with a bandage (dressing) and medicine.  Your arm  will be placed in a sling. The procedure may vary among health care providers and hospitals. What happens after the procedure?  Your blood pressure, heart rate, breathing rate, and blood oxygen level will be monitored often until the medicines you were given have worn off.  You may have some pain. Medicines will be available to help you.  Do not drive for 24 hours if you received a sedative, or until your health care provider approves. This information is not intended to replace advice given to you by your health care provider. Make sure you discuss any questions you have with your health care provider. Document Released: 08/15/2015 Document Revised: 01/07/2016 Document Reviewed: 08/15/2015 Elsevier Interactive Patient Education  2017 [employer]  Surgery for Shoulder Impingement Syndrome, Care After Refer to this sheet in the next few weeks. These instructions provide you with information about caring for yourself after your procedure. Your health care provider may also  give you more specific instructions. Your treatment has been planned according to current medical practices, but problems sometimes occur. Call your health care provider if you have any problems or questions after your procedure. What can I expect after the procedure? After your procedure, it is common to have pain and stiffness in your shoulder area. This may include your arm, chest, and back. Follow these instructions at home: If you have a sling:   Wear the sling as told by your health care provider. Remove it only as told by your health care provider.  Loosen the sling if your fingers tingle, become numb, or turn cold and blue.  Do not let your sling get wet if it is not waterproof.  Keep the sling clean. Bathing   Do not take baths, swim, or use a hot tub until your health care provider approves. Ask your health care provider if you can take showers. You may only be allowed to take sponge baths for bathing.  If  you have a sling that is not waterproof, cover it with a watertight covering when you take a bath or a shower.  Keep your bandage (dressing) dry until your health care provider says it can be removed. Incision care    Check your incision area every day for signs of infection. Check for:  More redness, swelling, or pain.  More fluid or blood.  Warmth.  Pus or a bad smell.  Follow instructions from your health care provider about how to take care of your incision. Make sure you:  Wash your hands with soap and water before you change your bandage (dressing). If soap and water are not available, use hand sanitizer.  Change your dressing as told by your health care provider.  Leave stitches (sutures), skin glue, or adhesive strips in place. These skin closures may need to be in place for 2 weeks or longer. If adhesive strip edges start to loosen and curl up, you may trim the loose edges. Do not remove adhesive strips completely unless your health care provider tells you to do that. Driving   Do not drive or operate heavy machinery while taking prescription pain medicine.  Do not drive for 24 hours if you received a sedative.  Ask your health care provider when it is safe to drive if you have a sling on your arm. Managing pain, stiffness, and swelling    Move your fingers often to avoid stiffness and to lessen swelling.  Keep your arm and shoulder in the position recommended by your health care provider.  If directed, put ice on your shoulder.  Put ice in a plastic bag.  Place a towel between your skin and the bag.  Leave the ice on for 20 minutes, 2-3 times a day. Activity   Return to your normal activities as told by your health care provider. Ask your health care provider what activities are safe for you.  Do not lift anything that is heavier than 10 lb (4.5 kg) until your health care provider tells you that it is safe.  Do exercises and stretches as told by your health  care provider. General instructions    Take over-the-counter and prescription medicines only as told by your health care provider.  Do not use any tobacco products, such as cigarettes, chewing tobacco, and e-cigarettes. If you need help quitting, ask your health care provider.  Keep all follow-up visits as told by your health care provider. This is important. Contact a health care provider  if:  You have a fever.  You have pain that gets worse or does not get better with medicine.  You have more redness, swelling, or pain around your incision.  You have more fluid or blood coming from your incision.  Your incision feels warm to the touch.  You have pus or a bad smell coming from your incision.  You have muscle aches.  You are dizzy. Get help right away if:  You have severe pain in your shoulder, arm, or hand.  Your hand or arm becomes numb.  Your hand or arm feels unusually cold.  Your fingernails turn a dark color, such as blue or gray. This information is not intended to replace advice given to you by your health care provider. Make sure you discuss any questions you have with your health care provider. Document Released: 11/23/2015 Document Revised: 01/07/2016 Document Reviewed: 07/18/2015 Elsevier Interactive Patient Education  2017 [employer]  General Anesthesia, Adult General anesthesia is the use of medicines to make a person "go to sleep" (be unconscious) for a medical procedure. General anesthesia is often recommended when a procedure:  Is long.  Requires you to be still or in an unusual position.  Is major and can cause you to lose blood.  Is impossible to do without general anesthesia. The medicines used for general anesthesia are called general anesthetics. In addition to making you sleep, the medicines:  Prevent pain.  Control your blood pressure.  Relax your muscles. Tell a health care provider about:  Any allergies you have.  All medicines  you are taking, including vitamins, herbs, eye drops, creams, and over-the-counter medicines.  Any problems you or family members have had with anesthetic medicines.  Types of anesthetics you have had in the past.  Any bleeding disorders you have.  Any surgeries you have had.  Any medical conditions you have.  Any history of heart or lung conditions, such as heart failure, sleep apnea, or chronic obstructive pulmonary disease (COPD).  Whether you are pregnant or may be pregnant.  Whether you use tobacco, alcohol, marijuana, or street drugs.  Any history of [employer].  Any history of depression or anxiety. What are the risks? Generally, this is a safe procedure. However, problems may occur, including:  Allergic reaction to anesthetics.  Lung and heart problems.  Inhaling food or liquids from your stomach into your lungs (aspiration).  Injury to nerves.  Waking up during your procedure and being unable to move (rare).  Extreme agitation or a state of mental confusion (delirium) when you wake up from the anesthetic.  Air in the bloodstream, which can lead to stroke. These problems are more likely to develop if you are having a major surgery or if you have an advanced medical condition. You can prevent some of these complications by answering all of your health care provider's questions thoroughly and by following all pre-procedure instructions. General anesthesia can cause side effects, including:  Nausea or vomiting  A sore throat from the breathing tube.  Feeling cold or shivery.  Feeling tired, washed out, or achy.  Sleepiness or drowsiness.  Confusion or agitation. What happens before the procedure? Staying hydrated  Follow instructions from your health care provider about hydration, which may include:  Up to 2 hours before the procedure - you may continue to drink clear liquids, such as water, clear fruit juice, black coffee, and plain tea. Eating and  drinking restrictions  Follow instructions from your health care provider about eating and  drinking, which may include:  8 hours before the procedure - stop eating heavy meals or foods such as meat, fried foods, or fatty foods.  6 hours before the procedure - stop eating light meals or foods, such as toast or cereal.  6 hours before the procedure - stop drinking milk or drinks that contain milk.  2 hours before the procedure - stop drinking clear liquids. Medicines   Ask your health care provider about:  Changing or stopping your regular medicines. This is especially important if you are taking diabetes medicines or blood thinners.  Taking medicines such as aspirin and ibuprofen. These medicines can thin your blood. Do not take these medicines before your procedure if your health care provider instructs you not to.  Taking new dietary supplements or medicines. Do not take these during the week before your procedure unless your health care provider approves them.  If you are told to take a medicine or to continue taking a medicine on the day of the procedure, take the medicine with sips of water. General instructions    Ask if you will be going home the same day, the following day, or after a longer hospital stay.  Plan to have someone take you home.  Plan to have someone stay with you for the first 24 hours after you leave the hospital or clinic.  For 3-6 weeks before the procedure, try not to use any tobacco products, such as cigarettes, chewing tobacco, and e-cigarettes.  You may brush your teeth on the morning of the procedure, but make sure to spit out the toothpaste. What happens during the procedure?  You will be given anesthetics through a mask and through an IV tube in one of your veins.  You may receive medicine to help you relax (sedative).  As soon as you are asleep, a breathing tube may be used to help you breathe.  An anesthesia specialist will stay with you  throughout the procedure. He or she will help keep you comfortable and safe by continuing to give you medicines and adjusting the amount of medicine that you get. He or she will also watch your blood pressure, pulse, and oxygen levels to make sure that the anesthetics do not cause any problems.  If a breathing tube was used to help you breathe, it will be removed before you wake up. The procedure may vary among health care providers and hospitals. What happens after the procedure?  You will wake up, often slowly, after the procedure is complete, usually in a recovery area.  Your blood pressure, heart rate, breathing rate, and blood oxygen level will be monitored until the medicines you were given have worn off.  You may be given medicine to help you calm down if you feel anxious or agitated.  If you will be going home the same day, your health care provider may check to make sure you can stand, drink, and urinate.  Your health care providers will treat your pain and side effects before you go home.  Do not drive for 24 hours if you received a sedative.  You may:  Feel nauseous and vomit.  Have a sore throat.  Have mental slowness.  Feel cold or shivery.  Feel sleepy.  Feel tired.  Feel sore or achy, even in parts of your body where you did not have surgery. This information is not intended to replace advice given to you by your health care provider. Make sure you discuss any questions you have  with your health care provider. Document Released: 11/08/2007 Document Revised: 01/12/2016 Document Reviewed: 07/16/2015 Elsevier Interactive Patient Education  2017 [employer] General Anesthesia, Adult, Care After These instructions provide you with information about caring for yourself after your procedure. Your health care provider may also give you more specific instructions. Your treatment has been planned according to current medical practices, but problems sometimes occur. Call  your health care provider if you have any problems or questions after your procedure. What can I expect after the procedure? After the procedure, it is common to have:  Vomiting.  A sore throat.  Mental slowness. It is common to feel:  Nauseous.  Cold or shivery.  Sleepy.  Tired.  Sore or achy, even in parts of your body where you did not have surgery. Follow these instructions at home: For at least 24 hours after the procedure:   Do not:  Participate in activities where you could fall or become injured.  Drive.  Use heavy machinery.  Drink alcohol.  Take sleeping pills or medicines that cause drowsiness.  Make important decisions or sign legal documents.  Take care of children on your own.  Rest. Eating and drinking   If you vomit, drink water, juice, or soup when you can drink without vomiting.  Drink enough fluid to keep your urine clear or pale yellow.  Make sure you have little or no nausea before eating solid foods.  Follow the diet recommended by your health care provider. General instructions   Have a responsible adult stay with you until you are awake and alert.  Return to your normal activities as told by your health care provider. Ask your health care provider what activities are safe for you.  Take over-the-counter and prescription medicines only as told by your health care provider.  If you smoke, do not smoke without supervision.  Keep all follow-up visits as told by your health care provider. This is important. Contact a health care provider if:  You continue to have nausea or vomiting at home, and medicines are not helpful.  You cannot drink fluids or start eating again.  You cannot urinate after 8-12 hours.  You develop a skin rash.  You have fever.  You have increasing redness at the site of your procedure. Get help right away if:  You have difficulty breathing.  You have chest pain.  You have unexpected bleeding.  You  feel that you are having a life-threatening or urgent problem. This information is not intended to replace advice given to you by your health care provider. Make sure you discuss any questions you have with your health care provider. Document Released: 11/07/2000 Document Revised: 01/04/2016 Document Reviewed: 07/16/2015 Elsevier Interactive Patient Education  2017 [employer].

## 2016-10-31 ENCOUNTER — Encounter (HOSPITAL_COMMUNITY): Payer: Self-pay

## 2016-10-31 ENCOUNTER — Encounter (HOSPITAL_COMMUNITY)
Admission: RE | Admit: 2016-10-31 | Discharge: 2016-10-31 | Disposition: A | Discharge status: Home / Self Care | Payer: 59 | Source: Ambulatory Visit | Attending: Orthopedic Surgery | Admitting: Orthopedic Surgery

## 2016-10-31 DIAGNOSIS — N202 Calculus of kidney with calculus of ureter: Secondary | ICD-10-CM | POA: Diagnosis not present

## 2016-10-31 LAB — CBC WITH DIFFERENTIAL/PLATELET
BASOS ABS: 0.1 10*3/uL (ref 0.0–0.1)
Basophils Relative: 1 %
Eosinophils Absolute: 0.3 10*3/uL (ref 0.0–0.7)
Eosinophils Relative: 3 %
HEMATOCRIT: 41.5 % (ref 39.0–52.0)
Hemoglobin: 14.2 g/dL (ref 13.0–17.0)
LYMPHS PCT: 20 %
Lymphs Abs: 1.9 10*3/uL (ref 0.7–4.0)
MCH: 29.1 pg (ref 26.0–34.0)
MCHC: 34.2 g/dL (ref 30.0–36.0)
MCV: 85 fL (ref 78.0–100.0)
Monocytes Absolute: 0.6 10*3/uL (ref 0.1–1.0)
Monocytes Relative: 6 %
NEUTROS ABS: 6.6 10*3/uL (ref 1.7–7.7)
Neutrophils Relative %: 70 %
Platelets: 257 10*3/uL (ref 150–400)
RBC: 4.88 MIL/uL (ref 4.22–5.81)
RDW: 12.9 % (ref 11.5–15.5)
WBC: 9.4 10*3/uL (ref 4.0–10.5)

## 2016-10-31 LAB — BASIC METABOLIC PANEL
ANION GAP: 9 (ref 5–15)
BUN: 18 mg/dL (ref 6–20)
CO2: 24 mmol/L (ref 22–32)
Calcium: 9.4 mg/dL (ref 8.9–10.3)
Chloride: 102 mmol/L (ref 101–111)
Creatinine, Ser: 1.81 mg/dL — ABNORMAL HIGH (ref 0.61–1.24)
GFR calc Af Amer: 47 mL/min — ABNORMAL LOW (ref >60)
GFR, EST NON AFRICAN AMERICAN: 41 mL/min — AB (ref >60)
GLUCOSE: 186 mg/dL — AB (ref 65–99)
POTASSIUM: 3.9 mmol/L (ref 3.5–5.1)
Sodium: 135 mmol/L (ref 135–145)

## 2016-10-31 NOTE — Progress Notes (Signed)
   10/31/16 0916  OBSTRUCTIVE SLEEP APNEA  Have you ever been diagnosed with sleep apnea through a sleep study? No  Do you snore loudly (loud enough to be heard through closed doors)?  1  Do you often feel tired, fatigued, or sleepy during the daytime (such as falling asleep during driving or talking to someone)? 1  Has anyone observed you stop breathing during your sleep? 1  Do you have, or are you being treated for high blood pressure? 0  BMI more than 35 kg/m2? 0  Age > 50 (1-yes) 1  Neck circumference greater than:Male 16 inches or larger, Male 17inches or larger? 1  Male Gender (Yes=1) 1  Obstructive Sleep Apnea Score 6  Score 5 or greater  Results sent to PCP

## 2016-11-01 ENCOUNTER — Ambulatory Visit (INDEPENDENT_AMBULATORY_CARE_PROVIDER_SITE_OTHER): Payer: 59 | Admitting: Urology

## 2016-11-01 ENCOUNTER — Other Ambulatory Visit (HOSPITAL_COMMUNITY)
Admission: RE | Admit: 2016-11-01 | Discharge: 2016-11-01 | Disposition: A | Discharge status: Home / Self Care | Payer: 59 | Attending: Urology | Admitting: Urology

## 2016-11-01 DIAGNOSIS — N200 Calculus of kidney: Secondary | ICD-10-CM

## 2016-11-01 DIAGNOSIS — N202 Calculus of kidney with calculus of ureter: Secondary | ICD-10-CM | POA: Diagnosis not present

## 2016-11-02 ENCOUNTER — Ambulatory Visit (HOSPITAL_COMMUNITY)
Admission: RE | Admit: 2016-11-02 | Discharge: 2016-11-02 | Disposition: A | Discharge status: Home / Self Care | Payer: 59 | Source: Ambulatory Visit | Attending: Urology | Admitting: Urology

## 2016-11-02 ENCOUNTER — Other Ambulatory Visit: Payer: Self-pay | Admitting: Urology

## 2016-11-02 DIAGNOSIS — Z87442 Personal history of urinary calculi: Secondary | ICD-10-CM | POA: Diagnosis not present

## 2016-11-02 DIAGNOSIS — N202 Calculus of kidney with calculus of ureter: Secondary | ICD-10-CM

## 2016-11-02 NOTE — H&P (Signed)
Jesus Ingram is an 56 y.o. male.   Chief Complaint: Right shoulder pain HPI: The patient presented over a year ago with pain in his right arm and left elbow. He was diagnosed with a left cubital tunnel syndrome and had surgery however he continued to complain of pain in his right arm and upper shoulder associated with weakness night pain difficulty with activities of daily living. He did receive injections in the shoulder dated not relieve his pain is therapy was unsuccessful in returning him to normal function.  He had an MRI which showed a torn rotator cuff and he presents for rotator cuff repair  Past Medical History:  Diagnosis Date  . Arthritis   . Borderline diabetes   . Chronic back pain   . Diabetes mellitus without complication (HCC)   . GERD (gastroesophageal reflux disease)   . History of kidney stones    history of kidney stones.  . History of renal calculi   . Reflux    occasional, uses TUMS  . Restless leg syndrome   . Ruptured disc, thoracic   . Seasonal allergies   . Sleep apnea    lost weight and has gotten better    Past Surgical History:  Procedure Laterality Date  . CATARACT EXTRACTION W/PHACO  01/10/2012   Procedure: CATARACT EXTRACTION PHACO AND INTRAOCULAR LENS PLACEMENT (IOC);  Surgeon: Loraine Leriche T. Nile Riggs, MD;  Location: [facility];  Service: Ophthalmology;  Laterality: Right;  CDE 9.77  . CATARACT EXTRACTION W/PHACO Left 01/13/2015   Procedure: CATARACT EXTRACTION PHACO AND INTRAOCULAR LENS PLACEMENT (IOC);  Surgeon: Jethro Bolus, MD;  Location: [facility];  Service: Ophthalmology;  Laterality: Left;  CDE:3.08  . COLONOSCOPY N/A 04/25/2013   Procedure: COLONOSCOPY;  Surgeon: Malissa Hippo, MD;  Location: [facility];  Service: Endoscopy;  Laterality: N/A;  830  . CYSTOSCOPY/RETROGRADE/URETEROSCOPY/STONE EXTRACTION WITH BASKET  2011   with stents, [facility]; [place]  . ELBOW ARTHROSCOPY W/ SYNOVECTOMY    . LITHOTRIPSY    . ULNAR TUNNEL RELEASE Right 07/14/2016   Procedure: CUBITAL TUNNEL RELEASE;  Surgeon: Vickki Hearing, MD;  Location: [facility];  Service: Orthopedics;  Laterality: Right;  . WISDOM TOOTH EXTRACTION      Family History  Problem Relation Age of Onset  . Colon cancer Neg Hx    Social History:  reports that he has never smoked. His smokeless tobacco use includes Chew. He reports that he does not drink alcohol or use drugs.  Allergies:  Allergies  Allergen Reactions  . Morphine And Related Nausea And Vomiting    Patient prefers not to take to take    No prescriptions prior to admission.    No results found for this or any previous visit (from the past 48 hour(s)). No results found.  Review of Systems  Musculoskeletal: Positive for myalgias and neck pain.  All other systems reviewed and are negative.   There were no vitals taken for this visit. Physical Exam  Constitutional: He is oriented to person, place, and time. He appears well-developed and well-nourished. No distress.  HENT:  Head: Normocephalic and atraumatic.  Right Ear: External ear normal.  Left Ear: External ear normal.  Eyes: Conjunctivae and EOM are normal. Pupils are equal, round, and reactive to light. Right eye exhibits no discharge. Left eye exhibits no discharge. No scleral icterus.  Neck: Normal range of motion. Neck supple. No JVD present. No tracheal deviation present. No thyromegaly present.  Cardiovascular: Normal rate, regular rhythm and intact distal  pulses.   Respiratory: Effort normal and breath sounds normal. No stridor. No respiratory distress. He has no wheezes. He exhibits no tenderness.  GI: Soft. Bowel sounds are normal. He exhibits no distension and no mass. There is no tenderness.  Musculoskeletal:  Gait normal   Lymphadenopathy:    He has no cervical adenopathy.  Neurological: He is alert and oriented to person, place, and time. He has normal reflexes. He displays normal reflexes. No cranial nerve deficit. He exhibits normal muscle  tone. Coordination normal.  Skin: Skin is warm and dry. No rash noted. He is not diaphoretic. No erythema. No pallor.  Psychiatric: He has a normal mood and affect. His behavior is normal. Judgment and thought content normal.    Right shoulder full passive range of motion with painful range of motion throughout the arc of 100-150 degrees extending up to 180. Supraspinatus tendon weakness in abduction as well as forward elevation scapular plane. There is no instability noted in abduction external rotation. Neurovascular exam remains intact, has tenderness in the rotator interval of the right shoulder  His lower extremities are unremarkable  His left upper extremity: Full range of motion stability strength and alignment no tenderness.  Assessment/Plan Rotator cuff tear right shoulder  Arthroscopic rotator cuff repair with option for mini open procedure  Informed consent was given complications and risks benefit ratio were discussed    [name], MD 11/02/2016, 11:09 AM

## 2016-11-03 ENCOUNTER — Encounter (HOSPITAL_COMMUNITY)
Admission: RE | Disposition: A | Discharge status: Home / Self Care | Payer: Self-pay | Source: Ambulatory Visit | Attending: Orthopedic Surgery

## 2016-11-03 ENCOUNTER — Ambulatory Visit (HOSPITAL_COMMUNITY): Payer: 59 | Admitting: Anesthesiology

## 2016-11-03 ENCOUNTER — Ambulatory Visit (HOSPITAL_COMMUNITY)
Admission: RE | Admit: 2016-11-03 | Discharge: 2016-11-03 | Disposition: A | Discharge status: Home / Self Care | Payer: 59 | Source: Ambulatory Visit | Attending: Orthopedic Surgery | Admitting: Orthopedic Surgery

## 2016-11-03 ENCOUNTER — Encounter (HOSPITAL_COMMUNITY): Payer: Self-pay | Admitting: *Deleted

## 2016-11-03 DIAGNOSIS — E119 Type 2 diabetes mellitus without complications: Secondary | ICD-10-CM | POA: Diagnosis not present

## 2016-11-03 DIAGNOSIS — G4730 Sleep apnea, unspecified: Secondary | ICD-10-CM | POA: Diagnosis not present

## 2016-11-03 DIAGNOSIS — K219 Gastro-esophageal reflux disease without esophagitis: Secondary | ICD-10-CM | POA: Insufficient documentation

## 2016-11-03 DIAGNOSIS — Z7984 Long term (current) use of oral hypoglycemic drugs: Secondary | ICD-10-CM | POA: Insufficient documentation

## 2016-11-03 DIAGNOSIS — Z79899 Other long term (current) drug therapy: Secondary | ICD-10-CM | POA: Diagnosis not present

## 2016-11-03 DIAGNOSIS — F17220 Nicotine dependence, chewing tobacco, uncomplicated: Secondary | ICD-10-CM | POA: Insufficient documentation

## 2016-11-03 DIAGNOSIS — M75121 Complete rotator cuff tear or rupture of right shoulder, not specified as traumatic: Secondary | ICD-10-CM

## 2016-11-03 DIAGNOSIS — M75101 Unspecified rotator cuff tear or rupture of right shoulder, not specified as traumatic: Secondary | ICD-10-CM | POA: Diagnosis not present

## 2016-11-03 DIAGNOSIS — G2581 Restless legs syndrome: Secondary | ICD-10-CM | POA: Insufficient documentation

## 2016-11-03 DIAGNOSIS — M65811 Other synovitis and tenosynovitis, right shoulder: Secondary | ICD-10-CM | POA: Insufficient documentation

## 2016-11-03 HISTORY — PX: SHOULDER ARTHROSCOPY WITH ROTATOR CUFF REPAIR: SHX5685

## 2016-11-03 LAB — GLUCOSE, CAPILLARY
Glucose-Capillary: 94 mg/dL (ref 65–99)
Glucose-Capillary: 145 mg/dL — ABNORMAL HIGH (ref 65–99)

## 2016-11-03 SURGERY — ARTHROSCOPY, SHOULDER, WITH ROTATOR CUFF REPAIR
Anesthesia: General | Laterality: Right

## 2016-11-03 MED ORDER — KETOROLAC TROMETHAMINE 30 MG/ML IJ SOLN
15.0000 mg | Freq: Once | INTRAMUSCULAR | Status: AC
  Administered 2016-11-03: 15 mg via INTRAVENOUS

## 2016-11-03 MED ORDER — EPINEPHRINE PF 1 MG/ML IJ SOLN
INTRAMUSCULAR | Status: AC
  Filled 2016-11-03: qty 5

## 2016-11-03 MED ORDER — PROMETHAZINE HCL 12.5 MG PO TABS: 12.5000 mg | ORAL_TABLET | Freq: Four times a day (QID) | ORAL | 0 refills | Status: DC | PRN

## 2016-11-03 MED ORDER — NEOSTIGMINE METHYLSULFATE 10 MG/10ML IV SOLN
INTRAVENOUS | Status: DC | PRN
  Administered 2016-11-03: 1 mg via INTRAVENOUS
  Administered 2016-11-03: 2 mg via INTRAVENOUS

## 2016-11-03 MED ORDER — MIDAZOLAM HCL 5 MG/5ML IJ SOLN
INTRAMUSCULAR | Status: DC | PRN
  Administered 2016-11-03: 2 mg via INTRAVENOUS

## 2016-11-03 MED ORDER — MIDAZOLAM HCL 2 MG/2ML IJ SOLN
1.0000 mg | INTRAMUSCULAR | Status: AC
  Administered 2016-11-03: 2 mg via INTRAVENOUS

## 2016-11-03 MED ORDER — PROPOFOL 10 MG/ML IV BOLUS
INTRAVENOUS | Status: DC | PRN
  Administered 2016-11-03: 170 mg via INTRAVENOUS
  Administered 2016-11-03: 30 mg via INTRAVENOUS

## 2016-11-03 MED ORDER — ROCURONIUM BROMIDE 50 MG/5ML IV SOLN
INTRAVENOUS | Status: AC
  Filled 2016-11-03: qty 1

## 2016-11-03 MED ORDER — LACTATED RINGERS IV SOLN
INTRAVENOUS | Status: DC
  Administered 2016-11-03: 09:00:00 via INTRAVENOUS
  Administered 2016-11-03: 1000 mL via INTRAVENOUS

## 2016-11-03 MED ORDER — IPRATROPIUM-ALBUTEROL 0.5-2.5 (3) MG/3ML IN SOLN
3.0000 mL | Freq: Once | RESPIRATORY_TRACT | Status: AC
  Administered 2016-11-03: 3 mL via RESPIRATORY_TRACT

## 2016-11-03 MED ORDER — SUCCINYLCHOLINE CHLORIDE 20 MG/ML IJ SOLN
INTRAMUSCULAR | Status: AC
  Filled 2016-11-03: qty 1

## 2016-11-03 MED ORDER — PROPOFOL 10 MG/ML IV BOLUS
INTRAVENOUS | Status: AC
  Filled 2016-11-03: qty 40

## 2016-11-03 MED ORDER — GLYCOPYRROLATE 0.2 MG/ML IJ SOLN
INTRAMUSCULAR | Status: AC
  Filled 2016-11-03: qty 3

## 2016-11-03 MED ORDER — SODIUM CHLORIDE 0.9 % IR SOLN
Status: DC | PRN
  Administered 2016-11-03: 20000 mL

## 2016-11-03 MED ORDER — METHOCARBAMOL 1000 MG/10ML IJ SOLN
500.0000 mg | Freq: Once | INTRAVENOUS | Status: DC
  Filled 2016-11-03: qty 5

## 2016-11-03 MED ORDER — GLYCOPYRROLATE 0.2 MG/ML IJ SOLN
INTRAMUSCULAR | Status: AC
  Filled 2016-11-03: qty 1

## 2016-11-03 MED ORDER — FENTANYL CITRATE (PF) 100 MCG/2ML IJ SOLN
INTRAMUSCULAR | Status: DC | PRN
  Administered 2016-11-03 (×3): 50 ug via INTRAVENOUS
  Administered 2016-11-03: 100 ug via INTRAVENOUS
  Administered 2016-11-03: 50 ug via INTRAVENOUS

## 2016-11-03 MED ORDER — IBUPROFEN 800 MG PO TABS
800.0000 mg | ORAL_TABLET | Freq: Three times a day (TID) | ORAL | 1 refills | Status: DC | PRN
Start: 2016-11-03 — End: 2019-02-18

## 2016-11-03 MED ORDER — ARTIFICIAL TEARS OP OINT
TOPICAL_OINTMENT | OPHTHALMIC | Status: AC
  Filled 2016-11-03: qty 3.5

## 2016-11-03 MED ORDER — IPRATROPIUM-ALBUTEROL 0.5-2.5 (3) MG/3ML IN SOLN
RESPIRATORY_TRACT | Status: AC
  Filled 2016-11-03: qty 3

## 2016-11-03 MED ORDER — EPINEPHRINE PF 1 MG/ML IJ SOLN
INTRAMUSCULAR | Status: AC
  Filled 2016-11-03: qty 4

## 2016-11-03 MED ORDER — GLYCOPYRROLATE 0.2 MG/ML IJ SOLN
INTRAMUSCULAR | Status: DC | PRN
  Administered 2016-11-03: 0.6 mg via INTRAVENOUS

## 2016-11-03 MED ORDER — GLYCOPYRROLATE 0.2 MG/ML IJ SOLN
0.2000 mg | Freq: Once | INTRAMUSCULAR | Status: AC
  Administered 2016-11-03: 0.2 mg via INTRAVENOUS

## 2016-11-03 MED ORDER — KETOROLAC TROMETHAMINE 30 MG/ML IJ SOLN
INTRAMUSCULAR | Status: AC
  Filled 2016-11-03: qty 1

## 2016-11-03 MED ORDER — HYDROCODONE-ACETAMINOPHEN 7.5-325 MG PO TABS: 1.0000 | ORAL_TABLET | ORAL | 0 refills | Status: DC | PRN

## 2016-11-03 MED ORDER — EPINEPHRINE PF 1 MG/ML IJ SOLN
INTRAMUSCULAR | Status: AC
  Filled 2016-11-03: qty 1

## 2016-11-03 MED ORDER — CHLORHEXIDINE GLUCONATE 4 % EX LIQD: 60.0000 mL | Freq: Once | CUTANEOUS | Status: DC

## 2016-11-03 MED ORDER — EPHEDRINE SULFATE 50 MG/ML IJ SOLN
INTRAMUSCULAR | Status: DC | PRN
  Administered 2016-11-03: 10 mg via INTRAVENOUS

## 2016-11-03 MED ORDER — MIDAZOLAM HCL 2 MG/2ML IJ SOLN
INTRAMUSCULAR | Status: AC
  Filled 2016-11-03: qty 2

## 2016-11-03 MED ORDER — ONDANSETRON HCL 4 MG/2ML IJ SOLN
4.0000 mg | Freq: Once | INTRAMUSCULAR | Status: AC
  Administered 2016-11-03: 4 mg via INTRAVENOUS
  Filled 2016-11-03: qty 2

## 2016-11-03 MED ORDER — SODIUM CHLORIDE 0.9 % IN NEBU
INHALATION_SOLUTION | RESPIRATORY_TRACT | Status: AC
  Filled 2016-11-03: qty 3

## 2016-11-03 MED ORDER — BUPIVACAINE-EPINEPHRINE (PF) 0.5% -1:200000 IJ SOLN
INTRAMUSCULAR | Status: AC
  Filled 2016-11-03: qty 60

## 2016-11-03 MED ORDER — HYDROCODONE-ACETAMINOPHEN 7.5-325 MG PO TABS
1.0000 | ORAL_TABLET | Freq: Once | ORAL | Status: AC
  Administered 2016-11-03: 1 via ORAL
  Filled 2016-11-03: qty 1

## 2016-11-03 MED ORDER — LIDOCAINE HCL (PF) 0.5 % IJ SOLN
INTRAMUSCULAR | Status: AC
  Filled 2016-11-03: qty 50

## 2016-11-03 MED ORDER — SUCCINYLCHOLINE CHLORIDE 20 MG/ML IJ SOLN
INTRAMUSCULAR | Status: DC | PRN
  Administered 2016-11-03: 140 mg via INTRAVENOUS

## 2016-11-03 MED ORDER — ONDANSETRON HCL 4 MG/2ML IJ SOLN
INTRAMUSCULAR | Status: AC
  Filled 2016-11-03: qty 2

## 2016-11-03 MED ORDER — SODIUM CHLORIDE 0.9 % IR SOLN
Status: DC | PRN
  Administered 2016-11-03: 7 mL

## 2016-11-03 MED ORDER — HYDROMORPHONE HCL 1 MG/ML IJ SOLN: 0.2500 mg | INTRAMUSCULAR | Status: DC | PRN

## 2016-11-03 MED ORDER — LIDOCAINE HCL 1 % IJ SOLN
INTRAMUSCULAR | Status: DC | PRN
  Administered 2016-11-03: 30 mg via INTRADERMAL

## 2016-11-03 MED ORDER — BUPIVACAINE-EPINEPHRINE 0.5% -1:200000 IJ SOLN
INTRAMUSCULAR | Status: DC | PRN
  Administered 2016-11-03: 60 mL

## 2016-11-03 MED ORDER — FENTANYL CITRATE (PF) 250 MCG/5ML IJ SOLN
INTRAMUSCULAR | Status: AC
  Filled 2016-11-03: qty 10

## 2016-11-03 MED ORDER — LIDOCAINE HCL (PF) 1 % IJ SOLN
INTRAMUSCULAR | Status: AC
  Filled 2016-11-03: qty 5

## 2016-11-03 MED ORDER — ONDANSETRON HCL 4 MG/2ML IJ SOLN
4.0000 mg | Freq: Once | INTRAMUSCULAR | Status: AC
  Administered 2016-11-03: 4 mg via INTRAVENOUS

## 2016-11-03 MED ORDER — METHOCARBAMOL 500 MG PO TABS: 500.0000 mg | ORAL_TABLET | Freq: Four times a day (QID) | ORAL | 1 refills | Status: DC

## 2016-11-03 MED ORDER — METHOCARBAMOL 1000 MG/10ML IJ SOLN
500.0000 mg | Freq: Four times a day (QID) | INTRAVENOUS | Status: DC
  Administered 2016-11-03: 500 mg via INTRAVENOUS
  Filled 2016-11-03 (×5): qty 5

## 2016-11-03 MED ORDER — ROCURONIUM BROMIDE 100 MG/10ML IV SOLN
INTRAVENOUS | Status: DC | PRN
  Administered 2016-11-03: 5 mg via INTRAVENOUS
  Administered 2016-11-03: 35 mg via INTRAVENOUS

## 2016-11-03 MED ORDER — SODIUM CHLORIDE 0.9 % IJ SOLN
INTRAMUSCULAR | Status: AC
  Filled 2016-11-03: qty 10

## 2016-11-03 MED ORDER — CEFAZOLIN SODIUM-DEXTROSE 2-4 GM/100ML-% IV SOLN
2.0000 g | INTRAVENOUS | Status: AC
  Administered 2016-11-03: 2 g via INTRAVENOUS
  Filled 2016-11-03: qty 100

## 2016-11-03 MED ORDER — EPHEDRINE SULFATE 50 MG/ML IJ SOLN
INTRAMUSCULAR | Status: AC
  Filled 2016-11-03: qty 1

## 2016-11-03 SURGICAL SUPPLY — 98 items
ADH SKN CLS APL DERMABOND .7 (GAUZE/BANDAGES/DRESSINGS) ×1
BAG HAMPER (MISCELLANEOUS) ×2 IMPLANT
BIT DRILL 2.0MX128MM (BIT) IMPLANT
BLADE AGGRESSIVE PLUS 4.0 (BLADE) ×2 IMPLANT
BLADE AVERAGE 25X9 (BLADE) IMPLANT
BLADE HEX COATED 2.75 (ELECTRODE) ×2 IMPLANT
BLADE SURG 15 STRL LF DISP TIS (BLADE) ×1 IMPLANT
BLADE SURG 15 STRL SS (BLADE) ×2
BLADE SURG SZ10 CARB STEEL (BLADE) ×2 IMPLANT
BLADE SURG SZ11 CARB STEEL (BLADE) ×2 IMPLANT
BNDG COHESIVE 4X5 TAN STRL (GAUZE/BANDAGES/DRESSINGS) ×2 IMPLANT
BUR 5.0 BARRELL (BURR) IMPLANT
BUR BARRELL 4.0 (BURR) ×1 IMPLANT
BUR ROUND 5.0 (BURR) IMPLANT
CANNULA DRILOCK 5.0X75 (CANNULA) IMPLANT
CANNULA DRILOCK 6.5X75 (CANNULA) ×3 IMPLANT
CANNULA DRILOCK 8.0X75 (CANNULA) IMPLANT
CHLORAPREP W/TINT 26ML (MISCELLANEOUS) ×2 IMPLANT
CLOTH BEACON ORANGE TIMEOUT ST (SAFETY) ×2 IMPLANT
COVER LIGHT HANDLE STERIS (MISCELLANEOUS) ×4 IMPLANT
COVER PROBE W GEL 5X96 (DRAPES) ×2 IMPLANT
DECANTER SPIKE VIAL GLASS SM (MISCELLANEOUS) ×3 IMPLANT
DERMABOND ADVANCED (GAUZE/BANDAGES/DRESSINGS) ×1
DERMABOND ADVANCED .7 DNX12 (GAUZE/BANDAGES/DRESSINGS) IMPLANT
DRAPE PROXIMA HALF (DRAPES) ×2 IMPLANT
DRAPE SHOULDER BEACH CHAIR (DRAPES) ×2 IMPLANT
DRAPE U-SHAPE 47X51 STRL (DRAPES) ×2 IMPLANT
DRSG MEPILEX BORDER 4X8 (GAUZE/BANDAGES/DRESSINGS) IMPLANT
ELECT REM PT RETURN 9FT ADLT (ELECTROSURGICAL) ×2
ELECTRODE REM PT RTRN 9FT ADLT (ELECTROSURGICAL) ×1 IMPLANT
FIBERSTICK 2 (SUTURE) IMPLANT
GAUZE SPONGE 4X4 16PLY XRAY LF (GAUZE/BANDAGES/DRESSINGS) ×2 IMPLANT
GLOVE BIOGEL PI IND STRL 7.0 (GLOVE) ×1 IMPLANT
GLOVE BIOGEL PI INDICATOR 7.0 (GLOVE) ×1
GLOVE SKINSENSE NS SZ8.0 LF (GLOVE) ×8
GLOVE SKINSENSE STRL SZ8.0 LF (GLOVE) ×1 IMPLANT
GLOVE SS N UNI LF 8.5 STRL (GLOVE) ×2 IMPLANT
GOWN STRL REUS W/TWL LRG LVL3 (GOWN DISPOSABLE) ×7 IMPLANT
GOWN STRL REUS W/TWL XL LVL3 (GOWN DISPOSABLE) ×2 IMPLANT
IMMOBILIZER SHOULDER LGE (ORTHOPEDIC SUPPLIES) IMPLANT
IMMOBILIZER SHOULDER MED (ORTHOPEDIC SUPPLIES) IMPLANT
INST SET MINOR BONE (KITS) ×2 IMPLANT
IV NS IRRIG 3000ML ARTHROMATIC (IV SOLUTION) ×4 IMPLANT
KIT BLADEGUARD II DBL (SET/KITS/TRAYS/PACK) ×2 IMPLANT
KIT POSITION SHOULDER SCHLEI (MISCELLANEOUS) ×2 IMPLANT
KIT ROOM TURNOVER APOR (KITS) ×2 IMPLANT
MANIFOLD NEPTUNE II (INSTRUMENTS) ×4 IMPLANT
MARKER SKIN DUAL TIP RULER LAB (MISCELLANEOUS) ×3 IMPLANT
NDL HYPO 18GX1.5 BLUNT FILL (NEEDLE) ×1 IMPLANT
NDL HYPO 21X1.5 SAFETY (NEEDLE) ×1 IMPLANT
NDL MAYO 6 CRC TAPER PT (NEEDLE) IMPLANT
NDL SCORPION (NEEDLE) IMPLANT
NDL SPNL 18GX3.5 QUINCKE PK (NEEDLE) ×1 IMPLANT
NEEDLE HYPO 18GX1.5 BLUNT FILL (NEEDLE) ×2 IMPLANT
NEEDLE HYPO 21X1.5 SAFETY (NEEDLE) ×2 IMPLANT
NEEDLE MAYO 6 CRC TAPER PT (NEEDLE) IMPLANT
NEEDLE SCORPION (NEEDLE) ×2 IMPLANT
NEEDLE SPNL 18GX3.5 QUINCKE PK (NEEDLE) ×2 IMPLANT
NS IRRIG 1000ML POUR BTL (IV SOLUTION) ×2 IMPLANT
PACK BASIC III (CUSTOM PROCEDURE TRAY) ×2
PACK SRG BSC III STRL LF ECLPS (CUSTOM PROCEDURE TRAY) ×1 IMPLANT
PAD ABD 5X9 TENDERSORB (GAUZE/BANDAGES/DRESSINGS) IMPLANT
PAD ARMBOARD 7.5X6 YLW CONV (MISCELLANEOUS) ×2 IMPLANT
PASSER SUT CAPTURE FIRST (SUTURE) ×1 IMPLANT
PASSER SUT SWANSON 36MM LOOP (INSTRUMENTS) ×2 IMPLANT
PENCIL HANDSWITCHING (ELECTRODE) ×2 IMPLANT
PROBE BIPOLAR ATHRO 135MM 90D (MISCELLANEOUS) ×1 IMPLANT
RASP SM TEAR CROSS CUT (RASP) IMPLANT
SET ARTHROSCOPY INST (INSTRUMENTS) ×2 IMPLANT
SET BASIN LINEN APH (SET/KITS/TRAYS/PACK) ×2 IMPLANT
SLING ARM IMMOBILIZER LRG (SOFTGOODS) ×1 IMPLANT
SPEEDBRIDGE W/PEEK SWIVELOCK (Orthopedic Implant) ×2 IMPLANT
STOCKINETTE IMPERVIOUS LG (DRAPES) ×2 IMPLANT
SUT BONE WAX W31G (SUTURE) IMPLANT
SUT ETHIBOND NAB OS 4 #2 30IN (SUTURE) ×1 IMPLANT
SUT ETHILON 3 0 FSL (SUTURE) IMPLANT
SUT FIBERWIRE #2 38 REV NDL BL (SUTURE)
SUT FIBERWIRE #2 38 T-5 BLUE (SUTURE)
SUT LASSO 45 DEGREE (SUTURE) IMPLANT
SUT LASSO 45 DEGREE LEFT (SUTURE) IMPLANT
SUT LASSO 45D RIGHT (SUTURE) IMPLANT
SUT MNCRL 0 VIOLET CTX 36 (SUTURE) ×1 IMPLANT
SUT MON AB 2-0 CT1 36 (SUTURE) ×1 IMPLANT
SUT MONOCRYL 0 CTX 36 (SUTURE) ×1
SUT PROLENE 2 0 FS (SUTURE) IMPLANT
SUT VIC AB 1 CT1 27 (SUTURE)
SUT VIC AB 1 CT1 27XBRD ANTBC (SUTURE) IMPLANT
SUTURE FIBERWR #2 38 T-5 BLUE (SUTURE) IMPLANT
SUTURE FIBERWR#2 38 REV NDL BL (SUTURE) IMPLANT
SYR 30ML LL (SYRINGE) ×2 IMPLANT
SYR 5ML LL (SYRINGE) ×2 IMPLANT
SYR BULB IRRIGATION 50ML (SYRINGE) IMPLANT
SYSTEM IMPLNT SPDBRDG PK SWVLK (Orthopedic Implant) IMPLANT
TOWEL OR 17X26 4PK STRL BLUE (TOWEL DISPOSABLE) ×2 IMPLANT
TUBING ARTHROSCOPY INFLOW/OUT (IRRIGATION / IRRIGATOR) ×2 IMPLANT
WAND 90 DEG TURBOVAC W/CORD (SURGICAL WAND) ×2 IMPLANT
YANKAUER SUCT 12FT TUBE ARGYLE (SUCTIONS) ×2 IMPLANT
YANKAUER SUCT BULB TIP 10FT TU (MISCELLANEOUS) ×3 IMPLANT

## 2016-11-03 NOTE — Anesthesia Postprocedure Evaluation (Signed)
Anesthesia Post Note  Patient: Jesus Ingram  Procedure(s) Performed: Procedure(s) (LRB): SHOULDER ARTHROSCOPY WITH ROTATOR CUFF REPAIR (Right)  Patient location during evaluation: PACU Anesthesia Type: General Level of consciousness: awake, oriented and patient cooperative Pain management: pain level controlled Vital Signs Assessment: post-procedure vital signs reviewed and stable Respiratory status: spontaneous breathing, nonlabored ventilation and respiratory function stable Cardiovascular status: blood pressure returned to baseline Postop Assessment: no signs of nausea or vomiting Anesthetic complications: no     Last Vitals:  Vitals:   11/03/16 0947 11/03/16 1000  BP: (!) 156/97 (!) 145/86  Pulse:  90  Resp: 16 (!) 8  Temp: 36.4 C     Last Pain:  Vitals:   11/03/16 0947  TempSrc:   PainSc: 6                  Tashi Band J

## 2016-11-03 NOTE — Brief Op Note (Addendum)
11/03/2016  9:34 AM  PATIENT:  Jesus Ingram  56 y.o. male  PRE-OPERATIVE DIAGNOSIS:  RIGHT ROTATOR CUFF TEAR  POST-OPERATIVE DIAGNOSIS:  RIGHT ROTATOR CUFF TEAR  Operative findings 1-1/2 cm front to back full-thickness supraspinatus tendon tear normal glenohumeral joint normal biceps anchor small degenerative fraying of the superior edge of the subscapularis   PROCEDURE:  Procedure(s): SHOULDER ARTHROSCOPY WITH OPEN ROTATOR CUFF REPAIR (Right)  CPT CODE 1610923412  Implants speed bridge from [employer] 4 anchors  SURGEON:  Surgeon(s) and Role:    * Vickki HearingStanley E Harrison, MD - Primary  Surgical procedure was done as follows. The patient was identified in the preop area the chart was reviewed and the surgical site was confirmed as right shoulder and marked. He was taken to surgery was given appropriate amount of Ancef based on his weight he was intubated using a kaleidoscope successfully without complication  He was placed in the modified beachchair position with appropriate padding  After sterile prep and drape the timeout was completed  We then injected the posterior portal site made a small incision and placed a scope into the joint. A diagnostic arthroscopy was performed. There was mild synovitis of the joint near the rotator cuff insertion the rotator cuff tear was easily visible. The biceps tendon was intact the glenohumeral joint was normal labrum was normal there were no loose bodies in the axillary pouch  He did have degenerative fraying of the superior edge of the subscapularis.  Through an anterior portal we placed a shaver and debrided the subscapularis. We then debrided the synovitis irritation near the rotator cuff and debridement portion of the rotator cuff tendon which was the supraspinatus  We then placed a scope into the subacromial space. He had no impingement  We did a bursectomy for visualization  We made a lateral portal and decorticated the greater tuberosity  with a bur and then placed a tacking suture and brought it out through an anterior portal  We then extended the anterior portal for 3 cm. We bluntly dissected down to the joint and prepared the tendon for repair  We made a punch placed one anchor made a second puncture centimeter anteriorly and made a insertion of a second anchor. We then passed fiber tape from each anchor through the cuff. We crisscrossed the sutures and then made a lateral row using a punch and suture anchor  We cut the tails flush down to bone. We checked the tension on the repair was excellent thickened depression on the tendon was great  Irrigated the wound and then closed the deltoid split with 0 Monocryl in interrupted fashion. Replacement layer of 0 Monocryl subcutaneous tissue. We then placed a running 2-0 Monocryl and Dermabond. We closed each portal with 2-0 Monocryl. Placed Dermabond over each portal. We covered the wound with dressings  We placed the shoulder immobilizer on  The patient was extubated and taken to recovery room in stable condition  PHYSICIAN ASSISTANT:   ASSISTANTS: betty ashley    ANESTHESIA:   general  EBL:  Total I/O In: 1200 [I.V.:1200] Out: 25 [Blood:25]  BLOOD ADMINISTERED:none  DRAINS: none   LOCAL MEDICATIONS USED:  MARCAINE   , Amount: 60 ml and OTHER epi   SPECIMEN:  No Specimen  DISPOSITION OF SPECIMEN:  N/A  COUNTS:  YES  TOURNIQUET:  * No tourniquets in log *  DICTATION: .Dragon Dictation  PLAN OF CARE: Discharge to home after PACU  PATIENT DISPOSITION:  PACU - hemodynamically stable.  Delay start of Pharmacological VTE agent (>24hrs) due to surgical blood loss or risk of bleeding: not applicable  

## 2016-11-03 NOTE — Transfer of Care (Signed)
Immediate Anesthesia Transfer of Care Note  Patient: Jesus Ingram  Procedure(s) Performed: Procedure(s): SHOULDER ARTHROSCOPY WITH ROTATOR CUFF REPAIR (Right)  Patient Location: PACU  Anesthesia Type:General  Level of Consciousness: awake and patient cooperative  Airway & Oxygen Therapy: Patient Spontanous Breathing and Patient connected to face mask oxygen  Post-op Assessment: Report given to RN, Post -op Vital signs reviewed and stable and Patient moving all extremities  Post vital signs: Reviewed and stable  Last Vitals:  Vitals:   11/03/16 0720 11/03/16 0725  BP: 105/67 126/78  Pulse:    Resp: 14 18  Temp:      Last Pain:  Vitals:   11/03/16 0646  TempSrc: Oral  PainSc: 1       Patients Stated Pain Goal: 9 (11/03/16 0646)  Complications: No apparent anesthesia complications

## 2016-11-03 NOTE — Op Note (Signed)
11/03/2016  9:34 AM  PATIENT:  Jesus Ingram  56 y.o. male  PRE-OPERATIVE DIAGNOSIS:  RIGHT ROTATOR CUFF TEAR  POST-OPERATIVE DIAGNOSIS:  RIGHT ROTATOR CUFF TEAR  Operative findings 1-1/2 cm front to back full-thickness supraspinatus tendon tear normal glenohumeral joint normal biceps anchor small degenerative fraying of the superior edge of the subscapularis   PROCEDURE:  Procedure(s): SHOULDER ARTHROSCOPY WITH OPEN ROTATOR CUFF REPAIR (Right)  CPT CODE 23412  Implants speed bridge from [employer] 4 anchors  SURGEON:  Surgeon(s) and Role:    * Kylen Ismael E Vinnie Bobst, MD - Primary  Surgical procedure was done as follows. The patient was identified in the preop area the chart was reviewed and the surgical site was confirmed as right shoulder and marked. He was taken to surgery was given appropriate amount of Ancef based on his weight he was intubated using a kaleidoscope successfully without complication  He was placed in the modified beachchair position with appropriate padding  After sterile prep and drape the timeout was completed  We then injected the posterior portal site made a small incision and placed a scope into the joint. A diagnostic arthroscopy was performed. There was mild synovitis of the joint near the rotator cuff insertion the rotator cuff tear was easily visible. The biceps tendon was intact the glenohumeral joint was normal labrum was normal there were no loose bodies in the axillary pouch  He did have degenerative fraying of the superior edge of the subscapularis.  Through an anterior portal we placed a shaver and debrided the subscapularis. We then debrided the synovitis irritation near the rotator cuff and debridement portion of the rotator cuff tendon which was the supraspinatus  We then placed a scope into the subacromial space. He had no impingement  We did a bursectomy for visualization  We made a lateral portal and decorticated the greater tuberosity  with a bur and then placed a tacking suture and brought it out through an anterior portal  We then extended the anterior portal for 3 cm. We bluntly dissected down to the joint and prepared the tendon for repair  We made a punch placed one anchor made a second puncture centimeter anteriorly and made a insertion of a second anchor. We then passed fiber tape from each anchor through the cuff. We crisscrossed the sutures and then made a lateral row using a punch and suture anchor  We cut the tails flush down to bone. We checked the tension on the repair was excellent thickened depression on the tendon was great  Irrigated the wound and then closed the deltoid split with 0 Monocryl in interrupted fashion. Replacement layer of 0 Monocryl subcutaneous tissue. We then placed a running 2-0 Monocryl and Dermabond. We closed each portal with 2-0 Monocryl. Placed Dermabond over each portal. We covered the wound with dressings  We placed the shoulder immobilizer on  The patient was extubated and taken to recovery room in stable condition  PHYSICIAN ASSISTANT:   ASSISTANTS: betty ashley    ANESTHESIA:   general  EBL:  Total I/O In: 1200 [I.V.:1200] Out: 25 [Blood:25]  BLOOD ADMINISTERED:none  DRAINS: none   LOCAL MEDICATIONS USED:  MARCAINE   , Amount: 60 ml and OTHER epi   SPECIMEN:  No Specimen  DISPOSITION OF SPECIMEN:  N/A  COUNTS:  YES  TOURNIQUET:  * No tourniquets in log *  DICTATION: .Dragon Dictation  PLAN OF CARE: Discharge to home after PACU  PATIENT DISPOSITION:  PACU - hemodynamically stable.     Delay start of Pharmacological VTE agent (>24hrs) due to surgical blood loss or risk of bleeding: not applicable  

## 2016-11-03 NOTE — Anesthesia Procedure Notes (Signed)
Procedure Name: Intubation Date/Time: 11/03/2016 7:42 AM Performed by: Despina HiddenIDACAVAGE, Amaia Lavallie J Pre-anesthesia Checklist: Patient identified, Patient being monitored, Timeout performed, Emergency Drugs available and Suction available Patient Re-evaluated:Patient Re-evaluated prior to inductionOxygen Delivery Method: Circle System Utilized Preoxygenation: Pre-oxygenation with 100% oxygen Intubation Type: IV induction, Rapid sequence and Cricoid Pressure applied Ventilation: Mask ventilation without difficulty Grade View: Grade II Tube type: Oral Tube size: 7.0 mm Number of attempts: 1 Airway Equipment and Method: stylet,  Video-laryngoscopy and Oral airway Placement Confirmation: ETT inserted through vocal cords under direct vision,  positive ETCO2 and breath sounds checked- equal and bilateral Secured at: 24 cm Tube secured with: Tape Dental Injury: Teeth and Oropharynx as per pre-operative assessment

## 2016-11-03 NOTE — Anesthesia Preprocedure Evaluation (Signed)
Anesthesia Evaluation  Patient identified by MRN, date of birth, ID band Patient awake    Reviewed: Allergy & Precautions, NPO status , Patient's Chart, lab work & pertinent test results  History of Anesthesia Complications (+) history of anesthetic complications (brochospasm on emergence)  Airway Mallampati: II  TM Distance: >3 FB     Dental  (+) Teeth Intact   Pulmonary neg pulmonary ROS, sleep apnea ,  Mild sinus congestion    breath sounds clear to auscultation       Cardiovascular negative cardio ROS   Rhythm:Regular Rate:Normal     Neuro/Psych  Neuromuscular disease    GI/Hepatic GERD  Medicated and Controlled,  Endo/Other  diabetes, Type 2, Oral Hypoglycemic Agents  Renal/GU      Musculoskeletal   Abdominal   Peds  Hematology   Anesthesia Other Findings   Reproductive/Obstetrics                             Anesthesia Physical Anesthesia Plan  ASA: III  Anesthesia Plan: General   Post-op Pain Management:    Induction: Intravenous, Rapid sequence and Cricoid pressure planned  Airway Management Planned: Oral ETT and Video Laryngoscope Planned  Additional Equipment:   Intra-op Plan:   Post-operative Plan: Extubation in OR  Informed Consent: I have reviewed the patients History and Physical, chart, labs and discussed the procedure including the risks, benefits and alternatives for the proposed anesthesia with the patient or authorized representative who has indicated his/her understanding and acceptance.     Plan Discussed with:   Anesthesia Plan Comments:         Anesthesia Quick Evaluation

## 2016-11-03 NOTE — Interval H&P Note (Signed)
History and Physical Interval Note:  11/03/2016 7:19 AM   BP 121/84   Pulse 80   Temp 98.1 F (36.7 C) (Oral)   Resp 10   Ht 5\' 11"  (1.803 m)   Wt 227 lb (103 kg)   SpO2 99%   BMI 31.66 kg/m     Jesus Ingram  has presented today for surgery, with the diagnosis of RIGHT ROTATOR CUFF TEAR  The various methods of treatment have been discussed with the patient and family. After consideration of risks, benefits and other options for treatment, the patient has consented to  Procedure(s): SHOULDER ARTHROSCOPY WITH ROTATOR CUFF REPAIR (Right) as a surgical intervention .  The patient's history has been reviewed, patient examined, no change in status, stable for surgery.  I have reviewed the patient's chart and labs.  Questions were answered to the patient's satisfaction.     Fuller CanadaStanley Jame Seelig

## 2016-11-07 ENCOUNTER — Encounter (HOSPITAL_COMMUNITY): Payer: Self-pay | Admitting: Orthopedic Surgery

## 2016-11-07 ENCOUNTER — Encounter: Payer: Self-pay | Admitting: Orthopedic Surgery

## 2016-11-07 ENCOUNTER — Ambulatory Visit (INDEPENDENT_AMBULATORY_CARE_PROVIDER_SITE_OTHER): Payer: 59 | Admitting: Orthopedic Surgery

## 2016-11-07 DIAGNOSIS — Z9889 Other specified postprocedural states: Secondary | ICD-10-CM | POA: Diagnosis not present

## 2016-11-07 DIAGNOSIS — Z4889 Encounter for other specified surgical aftercare: Secondary | ICD-10-CM

## 2016-11-07 NOTE — Progress Notes (Signed)
Patient ID: Jesus Ingram, male   DOB: October 05, 1960, 56 y.o.   MRN: [id number]  POSTOP VISIT   Chief Complaint  Patient presents with  . Follow-up    RT RCR, DOS 11/03/16    OP REPORT PRE-OPERATIVE DIAGNOSIS:  RIGHT ROTATOR CUFF TEAR  POST-OPERATIVE DIAGNOSIS:  RIGHT ROTATOR CUFF TEAR  Operative findings 1-1/2 cm front to back full-thickness supraspinatus tendon tear normal glenohumeral joint normal biceps anchor small degenerative fraying of the superior edge of the subscapularis   PROCEDURE:  Procedure(s): SHOULDER ARTHROSCOPY WITH OPEN ROTATOR CUFF REPAIR (Right)  CPT CODE 4098123412  Implants speed bridge from [employer] 4 anchors  History very well, his wounds are clean dry and intact he has no sensory deficits  He is taking Tylenol 3 instead of Tylenol with codeine. I would like him to stay in his sling for the next 2 weeks he can remove it for showers she can remove it to let his arm straighten out 3 times a day  Start therapy in 2 weeks after I see the wound Encounter Diagnoses  Name Primary?  Marland Kitchen. Aftercare following surgery Yes  . S/P right rotator cuff repair       11:47 AM Fuller CanadaStanley Patrici Minnis, MD 11/07/2016

## 2016-11-11 LAB — STONE ANALYSIS
Stone Weight KSTONE: 1.8 mg
Uric Acid: 100 %

## 2016-11-22 ENCOUNTER — Ambulatory Visit (INDEPENDENT_AMBULATORY_CARE_PROVIDER_SITE_OTHER): Payer: 59 | Admitting: Orthopedic Surgery

## 2016-11-22 ENCOUNTER — Encounter: Payer: Self-pay | Admitting: Orthopedic Surgery

## 2016-11-22 DIAGNOSIS — Z9889 Other specified postprocedural states: Secondary | ICD-10-CM

## 2016-11-22 DIAGNOSIS — Z4889 Encounter for other specified surgical aftercare: Secondary | ICD-10-CM

## 2016-11-22 NOTE — Progress Notes (Signed)
Patient ID: Jesus Ingram, male   DOB: [birth year], 56 y.o.   MRN: [id number]  POSTOP VISIT   Chief Complaint  Patient presents with  . Follow-up    SARS, RCR, DOS 11/03/16   Right shoulder rcr   Wound check: clean   Neuro-vascualr intact   Encounter Diagnoses  Name Primary?  Marland Kitchen Aftercare following surgery Yes  . S/P right rotator cuff repair     Return 4 weeks   Start OT   2:37 PM Fuller Canada, MD 11/22/2016

## 2016-11-25 ENCOUNTER — Encounter (HOSPITAL_COMMUNITY): Payer: Self-pay | Admitting: Occupational Therapy

## 2016-11-25 ENCOUNTER — Ambulatory Visit (HOSPITAL_COMMUNITY): Payer: 59 | Attending: Orthopedic Surgery | Admitting: Occupational Therapy

## 2016-11-25 DIAGNOSIS — R29898 Other symptoms and signs involving the musculoskeletal system: Secondary | ICD-10-CM | POA: Diagnosis present

## 2016-11-25 DIAGNOSIS — M25511 Pain in right shoulder: Secondary | ICD-10-CM | POA: Insufficient documentation

## 2016-11-25 DIAGNOSIS — M25611 Stiffness of right shoulder, not elsewhere classified: Secondary | ICD-10-CM

## 2016-11-25 NOTE — Patient Instructions (Signed)
SHOULDER: Flexion On Table   Place hands on table, elbows straight. Move hips away from body. Press hands down into table.  _10-15__ reps per set, _2-3__ sets per day  Abduction (Passive)   With arm out to side, resting on table, lower head toward arm, keeping trunk away from table.  Repeat __10-15__ times. Do _2-3___ sessions per day.  Copyright  [employer]. All rights reserved.     Internal Rotation (Assistive)   Seated with elbow bent at right angle and held against side, slide arm on table surface in an inward arc. Repeat _10-15___ times. Do _2-3___ sessions per day. Activity: Use this motion to brush crumbs off the table.  Copyright  [employer]. All rights reserved.    

## 2016-11-25 NOTE — Therapy (Signed)
[facility] [address] [place], [place], [address] Phone: [phone]   Fax:  [phone]  Occupational Therapy Evaluation  Patient Details  Name: Jesus Ingram MRN: [id number] Date of Birth: 19-Nov-1960 Referring Provider: Dr. Fuller Canada  Encounter Date: 11/25/2016      OT End of Session - 11/25/16 1029    Visit Number 1   Number of Visits 12   Date for OT Re-Evaluation 01/24/17  mini reassessment 12/23/16   Authorization Type [employer]   Authorization Time Period $30 copay; at 40th visit medical record review required   Authorization - Visit Number 1   Authorization - Number of Visits 40   OT Start Time [phone]   OT Stop Time 1000   OT Time Calculation (min) 54 min   Activity Tolerance Patient tolerated treatment well   Behavior During Therapy [facility] for tasks assessed/performed      Past Medical History:  Diagnosis Date  . Arthritis   . Borderline diabetes   . Chronic back pain   . Diabetes mellitus without complication (HCC)   . GERD (gastroesophageal reflux disease)   . History of kidney stones    history of kidney stones.  . History of renal calculi   . Reflux    occasional, uses TUMS  . Restless leg syndrome   . Ruptured disc, thoracic   . Seasonal allergies   . Sleep apnea    lost weight and has gotten better    Past Surgical History:  Procedure Laterality Date  . CATARACT EXTRACTION W/PHACO  01/10/2012   Procedure: CATARACT EXTRACTION PHACO AND INTRAOCULAR LENS PLACEMENT (IOC);  Surgeon: Loraine Leriche T. Nile Riggs, MD;  Location: [facility];  Service: Ophthalmology;  Laterality: Right;  CDE 9.77  . CATARACT EXTRACTION W/PHACO Left 01/13/2015   Procedure: CATARACT EXTRACTION PHACO AND INTRAOCULAR LENS PLACEMENT (IOC);  Surgeon: Jethro Bolus, MD;  Location: [facility];  Service: Ophthalmology;  Laterality: Left;  CDE:3.08  . COLONOSCOPY N/A 04/25/2013   Procedure: COLONOSCOPY;  Surgeon: Malissa Hippo, MD;  [facility]  [facility];  Service: Endoscopy;  Laterality: N/A;  830  . CYSTOSCOPY/RETROGRADE/URETEROSCOPY/STONE EXTRACTION WITH BASKET  2011   with stents, [facility]; [facility]  . ELBOW ARTHROSCOPY W/ SYNOVECTOMY    . LITHOTRIPSY    . SHOULDER ARTHROSCOPY WITH ROTATOR CUFF REPAIR Right 11/03/2016   Procedure: SHOULDER ARTHROSCOPY WITH ROTATOR CUFF REPAIR;  Surgeon: Vickki Hearing, MD;  Location: [facility];  Service: Orthopedics;  Laterality: Right;  . ULNAR TUNNEL RELEASE Right 07/14/2016   Procedure: CUBITAL TUNNEL RELEASE;  Surgeon: Vickki Hearing, MD;  Location: [facility];  Service: Orthopedics;  Laterality: Right;  . WISDOM TOOTH EXTRACTION      There were no vitals filed for this visit.      Subjective Assessment - 11/25/16 0914    Subjective  S: I think I'm doing pretty good, no pain very often.    Pertinent History Pt is a 56 y/o male s/p R RCR on 11/03/16, reporting minimal pain thus far since surgery. Pt has PMH significant for right cubital tunnel release on 07/14/17. Pt was referred to occupational therapy for evaluation and treatment by Dr. Fuller Canada.    Special Tests FOTO Score: 4/100 (96% impairment)   Patient Stated Goals To be able to use my right arm again.    Currently in Pain? No/denies   Multiple Pain Sites No           [facility] OT Assessment -  11/25/16 0908      Assessment   Diagnosis R RCR   Referring Provider Dr. Fuller Canada   Onset Date 11/03/16   Prior Therapy None     Precautions   Precautions Shoulder   Type of Shoulder Precautions P/ROM 4/13-5/11. AA/ROM and progress as tolerated.     Shoulder Interventions Shoulder sling/immobilizer;At all times     Balance Screen   Has the patient fallen in the past 6 months No   Has the patient had a decrease in activity level because of a fear of falling?  No   Is the patient reluctant to leave their home because of a fear of falling?  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      AROM   Overall AROM  Unable to assess;Due to precautions   AROM Assessment Site Shoulder   Right/Left Shoulder Right     PROM   Overall PROM Comments Assessed supine, er/IR adducted   PROM Assessment Site Shoulder   Right/Left Shoulder Right   Right Shoulder Flexion 105 Degrees   Right Shoulder ABduction 81 Degrees   Right Shoulder Internal Rotation 90 Degrees   Right Shoulder External Rotation 12 Degrees     Strength   Overall Strength Unable to assess;Due to precautions   Strength Assessment Site Shoulder   Right/Left Shoulder Right                         OT Education - 11/25/16 1029    Education provided Yes   Education Details table slides, pendulum exercises   Person(s) Educated Patient   Methods Explanation;Demonstration;Handout   Comprehension Verbalized understanding;Returned demonstration          OT Short Term Goals - 11/25/16 1034      OT SHORT TERM GOAL #1   Title Pt will be educated on HEP for improved mobility of RUE during daily tasks.    Time 6   Period Weeks   Status New     OT SHORT TERM GOAL #2   Title Pt will decrease RUE fascial restrictions from mod to minimal amounts to improve mobility needed for overhead reaching.    Time 6   Period Weeks   Status New     OT SHORT TERM GOAL #3   Title Pt will decrease pain in RUE to 4/10 to improve ability to use RUE as assist during daily tasks.    Time 6    Period Weeks   Status New     OT SHORT TERM GOAL #4   Title Pt will improve P/ROM of RUE to WNL to improve ability to donn shirts.    Time 6   Period Weeks   Status New     OT SHORT TERM GOAL #5   Title Pt will  improve RUE strength to 3+/5 to improve ability to reach items at shoulder height.    Time 6   Period Weeks   Status New           OT Long Term Goals - 11/25/16 1037      OT LONG TERM GOAL #1   Title Pt will return to highest level of functioning and independence in ADL and work tasks using RUE as assist.    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    Time 12   Period Weeks   Status New     OT LONG TERM GOAL #3   Title Pt will decrease RUE pain to 2/10 or less to improve ability to sleep in comfortable position at night.    Time 12   Period Weeks   Status New     OT LONG TERM GOAL #4   Title Pt will increase RUE A/ROM to [facility] to improve ability to perform overhead reaching tasks.    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    Time 12   Period Weeks   Status New               Plan - 11/25/16 1030    Clinical Impression Statement A: Pt is a 56 y/o male s/p right RCR on 11/03/16 presenting with limitations in functional task completion using RUE. Pt is unable to use RUE for daily, work, or leisure tasks. Pt educated on HEP including table slides and pendulum exercises this session, pt is going to come for therapy 1x/week and complete HEP due to high copay.    Rehab Potential Good   OT Frequency 1x / week   OT Duration 12 weeks   OT Treatment/Interventions Self-care/ADL training;Therapeutic exercise;Patient/family education;Ultrasound;Manual  Therapy;Cryotherapy;Therapeutic activities;Electrical Stimulation;Moist Heat;Passive range of motion   Plan P: Pt will benefit from skilled OT services to decrease pain and fascial restrictions, increase ROM and strength, improving ability to use RUE during daily task completion. Treatment plan: myofasical release, manual therapy, P/ROM, AA/ROM, A/ROM, general RUE strengthening, scapular stability and strengthening, modailities as needed.    OT Home Exercise Plan 4/13: table slides, pendulums   Consulted and Agree with Plan of Care Patient      Patient will benefit from skilled therapeutic intervention in order to improve the following deficits and impairments:  Impaired flexibility, Decreased strength, Decreased activity tolerance, Pain, Decreased range of motion, Increased fascial restricitons, Impaired UE functional use  Visit Diagnosis: Acute pain of right shoulder  Stiffness of right shoulder, not elsewhere classified  Other symptoms and signs involving the musculoskeletal system    Problem List Patient Active Problem List   Diagnosis Date Noted  . Complete tear of right rotator cuff   . Cubital tunnel syndrome on right    Ezra Sites, OTR/L  [phone] 11/25/2016, 10:41 AM  [facility] [address] [place], [place], [address] Phone: [phone]   Fax:  [phone]  Name: HARVY RIERA MRN: [id number] Date of Birth: 03/24/1961

## 2016-12-02 ENCOUNTER — Encounter (HOSPITAL_COMMUNITY): Payer: Self-pay | Admitting: Occupational Therapy

## 2016-12-02 ENCOUNTER — Ambulatory Visit (HOSPITAL_COMMUNITY): Payer: 59 | Admitting: Occupational Therapy

## 2016-12-02 DIAGNOSIS — M25611 Stiffness of right shoulder, not elsewhere classified: Secondary | ICD-10-CM

## 2016-12-02 DIAGNOSIS — M25511 Pain in right shoulder: Secondary | ICD-10-CM | POA: Diagnosis not present

## 2016-12-02 DIAGNOSIS — R29898 Other symptoms and signs involving the musculoskeletal system: Secondary | ICD-10-CM

## 2016-12-02 NOTE — Patient Instructions (Signed)
1) Seated Row   Sit up straight with elbows by your sides. Pull back with shoulders/elbows, keeping forearms straight, as if pulling back on the reins of a horse. Squeeze shoulder blades together. Repeat _10-15__times, _1-3___sets/day    2) Shoulder Elevation    Sit up straight with arms by your sides. Slowly bring your shoulders up towards your ears. Repeat_10-15__times, _1-3___ sets/day    3) Shoulder Extension    Sit up straight with both arms by your side, draw your arms back behind your waist. Keep your elbows straight. Repeat __10-15__times, __1-3__sets/day.        

## 2016-12-02 NOTE — Therapy (Signed)
[facility] [address] [place], [place], [address] Phone: [phone]   Fax:  [phone]  Occupational Therapy Treatment  Patient Details  Name: Jesus Ingram MRN: [id number] Date of Birth: [birth year] Referring Provider: Dr. Fuller Canada  Encounter Date: 12/02/2016      OT End of Session - 12/02/16 1204    Visit Number 2   Number of Visits 12   Date for OT Re-Evaluation 01/24/17  mini reassessment 12/23/16   Authorization Type [employer]   Authorization Time Period $30 copay; at 40th visit medical record review required   Authorization - Visit Number 2   Authorization - Number of Visits 40   OT Start Time [id number]   OT Stop Time 1030   OT Time Calculation (min) 42 min   Activity Tolerance Patient tolerated treatment well   Behavior During Therapy [facility] for tasks assessed/performed      Past Medical History:  Diagnosis Date  . Arthritis   . Borderline diabetes   . Chronic back pain   . Diabetes mellitus without complication (HCC)   . GERD (gastroesophageal reflux disease)   . History of kidney stones    history of kidney stones.  . History of renal calculi   . Reflux    occasional, uses TUMS  . Restless leg syndrome   . Ruptured disc, thoracic   . Seasonal allergies   . Sleep apnea    lost weight and has gotten better    Past Surgical History:  Procedure Laterality Date  . CATARACT EXTRACTION W/PHACO  01/10/2012   Procedure: CATARACT EXTRACTION PHACO AND INTRAOCULAR LENS PLACEMENT (IOC);  Surgeon: Loraine Leriche T. Nile Riggs, MD;  Location: [facility];  Service: Ophthalmology;  Laterality: Right;  CDE 9.77  . CATARACT EXTRACTION W/PHACO Left 01/13/2015   Procedure: CATARACT EXTRACTION PHACO AND INTRAOCULAR LENS PLACEMENT (IOC);  Surgeon: Jethro Bolus, MD;  Location: [facility];  Service: Ophthalmology;  Laterality: Left;  CDE:3.08  . COLONOSCOPY N/A 04/25/2013   Procedure: COLONOSCOPY;  Surgeon: Malissa Hippo, MD;  [facility];  Service: Endoscopy;  Laterality: N/A;  830  . CYSTOSCOPY/RETROGRADE/URETEROSCOPY/STONE EXTRACTION WITH BASKET  2011   with stents, [facility]; [facility]  . ELBOW ARTHROSCOPY W/ SYNOVECTOMY    . LITHOTRIPSY    . SHOULDER ARTHROSCOPY WITH ROTATOR CUFF REPAIR Right 11/03/2016   Procedure: SHOULDER ARTHROSCOPY WITH ROTATOR CUFF REPAIR;  Surgeon: Vickki Hearing, MD;  Location: [facility];  Service: Orthopedics;  Laterality: Right;  . ULNAR TUNNEL RELEASE Right 07/14/2016   Procedure: CUBITAL TUNNEL RELEASE;  Surgeon: Vickki Hearing, MD;  Location: [facility];  Service: Orthopedics;  Laterality: Right;  . WISDOM TOOTH EXTRACTION      There were no vitals filed for this visit.      Subjective Assessment - 12/02/16 0947    Subjective  S: It's been staying sore lately.    Currently in Pain? Yes   Pain Score 2    Pain Location Shoulder   Pain Orientation Right   Pain Descriptors / Indicators Sore   Pain Type Acute pain   Pain Radiating Towards none   Pain Onset 1 to 4 weeks ago   Pain Frequency Intermittent   Aggravating Factors  movement   Pain Relieving Factors heat, Aleve   Effect of Pain on Daily Activities unable to use RUE for ADL tasks   Multiple Pain Sites No            [facility] OT Assessment -  12/02/16 0947      Assessment   Diagnosis R RCR   Referring Provider Dr. Fuller Canada     Precautions   Precautions Shoulder   Type of Shoulder Precautions P/ROM 4/13-5/11. AA/ROM and progress as tolerated.     Shoulder Interventions Shoulder sling/immobilizer;At all times                  OT Treatments/Exercises (OP) - 12/02/16 0950      Exercises   Exercises Shoulder     Shoulder Exercises: Supine   Protraction PROM;10 reps   Horizontal ABduction PROM;10 reps   External Rotation PROM;10 reps   Internal Rotation PROM;10 reps   Flexion PROM;10 reps   ABduction PROM;10 reps     Shoulder Exercises: Seated   Elevation AROM;10 reps   Extension AROM;10  reps   Row AROM;10 reps     Shoulder Exercises: Therapy Ball   Flexion 10 reps   ABduction 10 reps     Shoulder Exercises: Isometric Strengthening   Flexion Supine;3X3"   Extension Supine;3X3"   External Rotation Supine;3X3"   Internal Rotation Supine;3X3"   ABduction Supine;3X3"   ADduction Supine;3X3"     Manual Therapy   Manual Therapy Myofascial release   Manual therapy comments completed separately from therapeutic exercises   Myofascial Release myofascial release to right upper arm, trapezius, and scapularis regions to decrease pain and fascial restrictions and increase joint mobility                OT Education - 12/02/16 1018    Education provided Yes   Education Details scapular A/ROM    Person(s) Educated Patient   Methods Explanation;Demonstration;Handout   Comprehension Verbalized understanding;Returned demonstration          OT Short Term Goals - 12/02/16 1206      OT SHORT TERM GOAL #1   Title Pt will be educated on HEP for improved mobility of RUE during daily tasks.    Time 6   Period Weeks   Status On-going     OT SHORT TERM GOAL #2   Title Pt will decrease RUE fascial restrictions from mod to minimal amounts to improve mobility needed for overhead reaching.    Time 6   Period Weeks   Status On-going     OT SHORT TERM GOAL #3   Title Pt will decrease pain in RUE to 4/10 to improve ability to use RUE as assist during daily tasks.    Time 6   Period Weeks   Status On-going     OT SHORT TERM GOAL #4   Title Pt will improve P/ROM of RUE to WNL to improve ability to donn shirts.    Time 6   Period Weeks   Status On-going     OT SHORT TERM GOAL #5   Title Pt will improve RUE strength to 3+/5 to improve ability to reach items at shoulder height.    Time 6   Period Weeks   Status On-going           OT Long Term Goals - 12/02/16 1206      OT LONG TERM GOAL #1   Title Pt will return to highest level of functioning and  independence in ADL and work tasks using RUE as assist.    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    Time 12   Period Weeks   Status On-going     OT LONG TERM GOAL #3   Title Pt will decrease RUE pain to 2/10 or less to improve ability to sleep in comfortable position at night.    Time 12   Period Weeks   Status On-going     OT LONG TERM GOAL #4   Title Pt will increase RUE A/ROM to [facility] to improve ability to perform overhead reaching tasks.    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    Time 12   Period Weeks   Status On-going               Plan - 12/02/16 1204    Clinical Impression Statement A: Initiated myofascial release, manual therapy, P/ROM, isometrics, and scapular A/ROM this session. Pt able to tolerate P/ROM [facility] this session, good form during scapular A/ROM. Pt provided with scapular A/ROM HEP, table slides reviewed.    Plan P: Follow up on HEP. Continue with protocol, complete isometrics in standing and update HEP for isometrics   OT Home Exercise Plan 4/13: table slides, pendulums; 4/20: scapular A/ROM   Consulted and Agree with Plan of Care Patient      Patient will benefit from skilled therapeutic intervention in order to improve the following deficits and impairments:  Impaired flexibility, Decreased strength, Decreased activity tolerance, Pain, Decreased range of motion, Increased fascial restricitons, Impaired UE functional use  Visit Diagnosis: Acute pain of right shoulder  Stiffness of right shoulder, not elsewhere classified  Other symptoms and signs involving the musculoskeletal system    Problem List Patient Active Problem List   Diagnosis Date  Noted  . Complete tear of right rotator cuff   . Cubital tunnel syndrome on right    Ezra Sites, OTR/L  [phone] 12/02/2016, 12:07 PM  [facility] [address] [place], [place], [address] Phone: [phone]   Fax:  [phone]  Name: Jesus Ingram MRN: [id number] Date of Birth: 02-11-1961

## 2016-12-05 DIAGNOSIS — E119 Type 2 diabetes mellitus without complications: Secondary | ICD-10-CM | POA: Diagnosis not present

## 2016-12-05 LAB — HEMOGLOBIN A1C: Hemoglobin A1C: 6.5

## 2016-12-07 DIAGNOSIS — M25511 Pain in right shoulder: Secondary | ICD-10-CM | POA: Diagnosis not present

## 2016-12-07 DIAGNOSIS — E119 Type 2 diabetes mellitus without complications: Secondary | ICD-10-CM | POA: Diagnosis not present

## 2016-12-08 ENCOUNTER — Other Ambulatory Visit (HOSPITAL_COMMUNITY): Payer: Self-pay | Admitting: Respiratory Therapy

## 2016-12-08 DIAGNOSIS — R0602 Shortness of breath: Secondary | ICD-10-CM

## 2016-12-09 ENCOUNTER — Ambulatory Visit (HOSPITAL_COMMUNITY): Payer: 59 | Admitting: Occupational Therapy

## 2016-12-09 ENCOUNTER — Encounter (HOSPITAL_COMMUNITY): Payer: Self-pay | Admitting: Occupational Therapy

## 2016-12-09 ENCOUNTER — Other Ambulatory Visit (HOSPITAL_BASED_OUTPATIENT_CLINIC_OR_DEPARTMENT_OTHER): Payer: Self-pay

## 2016-12-09 DIAGNOSIS — R29898 Other symptoms and signs involving the musculoskeletal system: Secondary | ICD-10-CM

## 2016-12-09 DIAGNOSIS — M25611 Stiffness of right shoulder, not elsewhere classified: Secondary | ICD-10-CM

## 2016-12-09 DIAGNOSIS — G4730 Sleep apnea, unspecified: Secondary | ICD-10-CM

## 2016-12-09 DIAGNOSIS — M25511 Pain in right shoulder: Secondary | ICD-10-CM

## 2016-12-09 NOTE — Patient Instructions (Signed)
Shoulder Isometric Exercises  Complete 5x each, holding for 5 seconds. Complete 2-3x/day.   1) Shoulder Flexion Standing up, place a towel between your arm/wrist and the wall. Press your arm forward into the wall gently, without moving your shoulder.     2) Shoulder Extension Standing with your back against a wall. Bend elbow to 90 degrees and place a towel between your bent elbow and the wall.  Press your elbow back into the wall gently, without moving your shoulder.        3) Shoulder Internal Rotation Standing just behind a doorway, place your affected hand inside the doorway so that your palm is facing the door jam.  Keeping your elbow at your side and bend to 90 degrees, slowly press your hand into the door jam.  Do not move shoulder or allow elbow to leave your side.     4) Shoulder External Rotation Standing in a doorway or to the side of a wall, Place the affected hand/wrist against the wall.  Keeping your elbow bent at 90 degrees and close to your body, press your hand/wrist outward against the wall.  Do not move your shoulder or elbow.          5) Shoulder Adduction Standing just behind a doorway, place a towel between the inside of your elbow and the doorframe. Keeping your elbow bent at 90 degrees and close to your side, gently press your elbow into the doorframe.  Do not move shoulder.     6) Shoulder Abduction Standing to the side of a wall, place a towel between your arm and the wall.  Press your arm into the wall gently.  Do not move your shoulder.    

## 2016-12-09 NOTE — Therapy (Signed)
[facility] [address] [place], [place], [address] Phone: [phone]   Fax:  [phone]  Occupational Therapy Treatment  Patient Details  Name: Jesus Ingram MRN: [id number] Date of Birth: 01-25-1961 Referring Provider: Dr. Fuller Canada  Encounter Date: 12/09/2016      OT End of Session - 12/09/16 1109    Visit Number 3   Number of Visits 12   Date for OT Re-Evaluation 01/24/17  mini reassessment 12/23/16   Authorization Type [employer]   Authorization Time Period $30 copay; at 40th visit medical record review required   Authorization - Visit Number 3   Authorization - Number of Visits 40   OT Start Time [phone]   OT Stop Time 1030   OT Time Calculation (min) 44 min   Activity Tolerance Patient tolerated treatment well   Behavior During Therapy [facility] for tasks assessed/performed      Past Medical History:  Diagnosis Date  . Arthritis   . Borderline diabetes   . Chronic back pain   . Diabetes mellitus without complication (HCC)   . GERD (gastroesophageal reflux disease)   . History of kidney stones    history of kidney stones.  . History of renal calculi   . Reflux    occasional, uses TUMS  . Restless leg syndrome   . Ruptured disc, thoracic   . Seasonal allergies   . Sleep apnea    lost weight and has gotten better    Past Surgical History:  Procedure Laterality Date  . CATARACT EXTRACTION W/PHACO  01/10/2012   Procedure: CATARACT EXTRACTION PHACO AND INTRAOCULAR LENS PLACEMENT (IOC);  Surgeon: Loraine Leriche T. Nile Riggs, MD;  Location: [facility] OR[facility];  Service: Ophthalmology;  Laterality: Right;  CDE 9.77  . CATARACT EXTRACTION W/PHACO Left 01/13/2015   Procedure: CATARACT EXTRACTION PHACO AND INTRAOCULAR LENS PLACEMENT (IOC);  Surgeon: Jethro Bolus, MD;  Location: [facility];  Service: Ophthalmology;  Laterality: Left;  CDE:3.08  . COLONOSCOPY N/A 04/25/2013   Procedure: COLONOSCOPY;  Surgeon: Malissa Hippo, MD;  Location: [facility]  ENDO SUITE;  Service: Endoscopy;  Laterality: N/A;  830  . CYSTOSCOPY/RETROGRADE/URETEROSCOPY/STONE EXTRACTION WITH BASKET  2011   with stents, [facility]; [facility]  . ELBOW ARTHROSCOPY W/ SYNOVECTOMY    . LITHOTRIPSY    . SHOULDER ARTHROSCOPY WITH ROTATOR CUFF REPAIR Right 11/03/2016   Procedure: SHOULDER ARTHROSCOPY WITH ROTATOR CUFF REPAIR;  Surgeon: Vickki Hearing, MD;  Location: [facility];  Service: Orthopedics;  Laterality: Right;  . ULNAR TUNNEL RELEASE Right 07/14/2016   Procedure: CUBITAL TUNNEL RELEASE;  Surgeon: Vickki Hearing, MD;  Location: [facility] ORS;  Service: Orthopedics;  Laterality: Right;  . WISDOM TOOTH EXTRACTION      There were no vitals filed for this visit.      Subjective Assessment - 12/09/16 0945    Subjective  S: It's been hurting down into my bicep.    Currently in Pain? Yes   Pain Score 1    Pain Location Shoulder   Pain Orientation Right   Pain Descriptors / Indicators Sore   Pain Type Acute pain   Pain Radiating Towards none   Pain Onset 1 to 4 weeks ago   Pain Frequency Intermittent   Aggravating Factors  movement   Pain Relieving Factors heat, aleve   Effect of Pain on Daily Activities unable to use RUE for ADL tasks   Multiple Pain Sites No            [facility]  OT Assessment - 12/09/16 0945      Assessment   Diagnosis R RCR   Referring Provider Dr. Fuller Canada     Precautions   Precautions Shoulder   Type of Shoulder Precautions P/ROM 4/13-5/11. AA/ROM and progress as tolerated.     Shoulder Interventions Shoulder sling/immobilizer;At all times                  OT Treatments/Exercises (OP) - 12/09/16 0949      Exercises   Exercises Shoulder     Shoulder Exercises: Supine   Protraction PROM;10 reps   Horizontal ABduction PROM;10 reps   External Rotation PROM;10 reps   Internal Rotation PROM;10 reps   Flexion PROM;10 reps   ABduction PROM;10 reps     Shoulder Exercises: Seated   Elevation AROM;12 reps   Extension  AROM;12 reps   Row AROM;12 reps     Shoulder Exercises: Therapy Ball   Flexion 10 reps   ABduction 10 reps     Shoulder Exercises: ROM/Strengthening   Anterior Glide 3X5"   Caudal Glide 3X5"     Shoulder Exercises: Isometric Strengthening   Flexion 3X5"  standing   Extension 3X5"  standing   External Rotation 3X5"  standing   Internal Rotation 3X5"  standing   ABduction 3X5"  standing   ADduction 3X5"  standing     Manual Therapy   Manual Therapy Myofascial release   Manual therapy comments completed separately from therapeutic exercises   Myofascial Release myofascial release to right upper arm, trapezius, and scapularis regions to decrease pain and fascial restrictions and increase joint mobility                OT Education - 12/09/16 1010    Education provided Yes   Education Details shoulder isometrics   Person(s) Educated Patient   Methods Explanation;Demonstration;Handout   Comprehension Verbalized understanding;Returned demonstration          OT Short Term Goals - 12/02/16 1206      OT SHORT TERM GOAL #1   Title Pt will be educated on HEP for improved mobility of RUE during daily tasks.    Time 6   Period Weeks   Status On-going     OT SHORT TERM GOAL #2   Title Pt will decrease RUE fascial restrictions from mod to minimal amounts to improve mobility needed for overhead reaching.    Time 6   Period Weeks   Status On-going     OT SHORT TERM GOAL #3   Title Pt will decrease pain in RUE to 4/10 to improve ability to use RUE as assist during daily tasks.    Time 6   Period Weeks   Status On-going     OT SHORT TERM GOAL #4   Title Pt will improve P/ROM of RUE to WNL to improve ability to donn shirts.    Time 6   Period Weeks   Status On-going     OT SHORT TERM GOAL #5   Title Pt will improve RUE strength to 3+/5 to improve ability to reach items at shoulder height.    Time 6   Period Weeks   Status On-going           OT Long  Term Goals - 12/02/16 1206      OT LONG TERM GOAL #1   Title Pt will return to highest level of functioning and independence in ADL and work tasks using RUE as assist.  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    Time 12   Period Weeks   Status On-going     OT LONG TERM GOAL #3   Title Pt will decrease RUE pain to 2/10 or less to improve ability to sleep in comfortable position at night.    Time 12   Period Weeks   Status On-going     OT LONG TERM GOAL #4   Title Pt will increase RUE A/ROM to [facility] to improve ability to perform overhead reaching tasks.    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    Time 12   Period Weeks   Status On-going               Plan - 12/09/16 1110    Clinical Impression Statement A: Pt reports minimal soreness today, is completing HEP daily. Pt with P/ROM flexion to 75%, abduction to 90% this date, continues to be limited with er. Added anterior and caudal glides this session, completed shoulder isometrics in standing, pt with good form. Pt provided with isometric strengthening HEP.    Plan P: Follow up on HEP. Continue with protocol, add low thumb tacks.    OT Home Exercise Plan 4/13: table slides, pendulums; 4/20: scapular A/ROM; 4/27: shoulder isometrics   Consulted and Agree with Plan of Care Patient      Patient will benefit from skilled therapeutic intervention in order to improve the following deficits and impairments:  Impaired flexibility, Decreased strength, Decreased activity tolerance, Pain, Decreased range of motion, Increased fascial restricitons, Impaired UE functional use  Visit Diagnosis: Acute pain of right shoulder  Stiffness of  right shoulder, not elsewhere classified  Other symptoms and signs involving the musculoskeletal system    Problem List Patient Active Problem List   Diagnosis Date Noted  . Complete tear of right rotator cuff   . Cubital tunnel syndrome on right    Ezra Sites, OTR/L  [phone] 12/09/2016, 11:12 AM   [facility] [address] [place], [place], [address] Phone: [phone]   Fax:  [phone]  Name: Jesus Ingram MRN: [id number] Date of Birth: 08/01/1961

## 2016-12-14 ENCOUNTER — Ambulatory Visit (HOSPITAL_COMMUNITY)
Admission: RE | Admit: 2016-12-14 | Discharge: 2016-12-14 | Disposition: A | Discharge status: Home / Self Care | Payer: 59 | Source: Ambulatory Visit | Attending: Pulmonary Disease | Admitting: Pulmonary Disease

## 2016-12-14 DIAGNOSIS — R0602 Shortness of breath: Secondary | ICD-10-CM | POA: Diagnosis present

## 2016-12-14 MED ORDER — ALBUTEROL SULFATE (2.5 MG/3ML) 0.083% IN NEBU
2.5000 mg | INHALATION_SOLUTION | Freq: Once | RESPIRATORY_TRACT | Status: AC
  Administered 2016-12-14: 2.5 mg via RESPIRATORY_TRACT

## 2016-12-15 LAB — PULMONARY FUNCTION TEST
DL/VA % PRED: 82 %
DL/VA: 3.93 ml/min/mmHg/L
DLCO cor % pred: 77 %
DLCO cor: 27.09 ml/min/mmHg
DLCO unc % pred: 77 %
DLCO unc: 27.09 ml/min/mmHg
FEF 25-75 Post: 4.45 L/sec
FEF 25-75 Pre: 4.68 L/sec
FEF2575-%Change-Post: -4 %
FEF2575-%PRED-PRE: 135 %
FEF2575-%Pred-Post: 128 %
FEV1-%Change-Post: -1 %
FEV1-%PRED-PRE: 99 %
FEV1-%Pred-Post: 98 %
FEV1-POST: 3.98 L
FEV1-Pre: 4.03 L
FEV1FVC-%Change-Post: 2 %
FEV1FVC-%Pred-Pre: 108 %
FEV6-%Change-Post: -3 %
FEV6-%Pred-Post: 91 %
FEV6-%Pred-Pre: 94 %
FEV6-POST: 4.65 L
FEV6-PRE: 4.82 L
FEV6FVC-%Change-Post: 0 %
FEV6FVC-%PRED-POST: 104 %
FEV6FVC-%PRED-PRE: 104 %
FVC-%CHANGE-POST: -3 %
FVC-%Pred-Post: 88 %
FVC-%Pred-Pre: 91 %
FVC-POST: 4.65 L
FVC-Pre: 4.82 L
POST FEV6/FVC RATIO: 100 %
PRE FEV1/FVC RATIO: 84 %
Post FEV1/FVC ratio: 85 %
Pre FEV6/FVC Ratio: 100 %
RV % pred: 70 %
RV: 1.57 L
TLC % pred: 87 %
TLC: 6.48 L

## 2016-12-16 ENCOUNTER — Ambulatory Visit (HOSPITAL_COMMUNITY): Payer: 59 | Attending: Orthopedic Surgery | Admitting: Occupational Therapy

## 2016-12-16 ENCOUNTER — Ambulatory Visit: Payer: 59 | Attending: Pulmonary Disease | Admitting: Neurology

## 2016-12-16 ENCOUNTER — Encounter (HOSPITAL_COMMUNITY): Payer: Self-pay | Admitting: Occupational Therapy

## 2016-12-16 DIAGNOSIS — G4733 Obstructive sleep apnea (adult) (pediatric): Secondary | ICD-10-CM | POA: Diagnosis not present

## 2016-12-16 DIAGNOSIS — M25511 Pain in right shoulder: Secondary | ICD-10-CM

## 2016-12-16 DIAGNOSIS — M25611 Stiffness of right shoulder, not elsewhere classified: Secondary | ICD-10-CM | POA: Diagnosis present

## 2016-12-16 DIAGNOSIS — G4730 Sleep apnea, unspecified: Secondary | ICD-10-CM

## 2016-12-16 DIAGNOSIS — R29898 Other symptoms and signs involving the musculoskeletal system: Secondary | ICD-10-CM | POA: Insufficient documentation

## 2016-12-16 NOTE — Therapy (Signed)
[facility] [address] [place], [place], [address] Phone: [phone]   Fax:  [phone]  Occupational Therapy Treatment  Patient Details  Name: Jesus Ingram MRN: [id number] Date of Birth: 05-31-1961 Referring Provider: Dr. Fuller Canada  Encounter Date: 12/16/2016      OT End of Session - 12/16/16 1127    Visit Number 4   Number of Visits 12   Date for OT Re-Evaluation 01/24/17   Authorization Type [employer]   Authorization Time Period $30 copay; at 40th visit medical record review required   Authorization - Visit Number 4   Authorization - Number of Visits 40   OT Start Time [id number]   OT Stop Time 1030   OT Time Calculation (min) 44 min   Activity Tolerance Patient tolerated treatment well   Behavior During Therapy [facility] for tasks assessed/performed      Past Medical History:  Diagnosis Date  . Arthritis   . Borderline diabetes   . Chronic back pain   . Diabetes mellitus without complication (HCC)   . GERD (gastroesophageal reflux disease)   . History of kidney stones    history of kidney stones.  . History of renal calculi   . Reflux    occasional, uses TUMS  . Restless leg syndrome   . Ruptured disc, thoracic   . Seasonal allergies   . Sleep apnea    lost weight and has gotten better    Past Surgical History:  Procedure Laterality Date  . CATARACT EXTRACTION W/PHACO  01/10/2012   Procedure: CATARACT EXTRACTION PHACO AND INTRAOCULAR LENS PLACEMENT (IOC);  Surgeon: Loraine Leriche T. Nile Riggs, MD;  Location: [facility];  Service: Ophthalmology;  Laterality: Right;  CDE 9.77  . CATARACT EXTRACTION W/PHACO Left 01/13/2015   Procedure: CATARACT EXTRACTION PHACO AND INTRAOCULAR LENS PLACEMENT (IOC);  Surgeon: Jethro Bolus, MD;  Location: [facility];  Service: Ophthalmology;  Laterality: Left;  CDE:3.08  . COLONOSCOPY N/A 04/25/2013   Procedure: COLONOSCOPY;  Surgeon: Malissa Hippo, MD;  [facility];  Service:  Endoscopy;  Laterality: N/A;  830  . CYSTOSCOPY/RETROGRADE/URETEROSCOPY/STONE EXTRACTION WITH BASKET  2011   with stents, [facility]; [facility]  . ELBOW ARTHROSCOPY W/ SYNOVECTOMY    . LITHOTRIPSY    . SHOULDER ARTHROSCOPY WITH ROTATOR CUFF REPAIR Right 11/03/2016   Procedure: SHOULDER ARTHROSCOPY WITH ROTATOR CUFF REPAIR;  Surgeon: Vickki Hearing, MD;  Location: [facility];  Service: Orthopedics;  Laterality: Right;  . ULNAR TUNNEL RELEASE Right 07/14/2016   Procedure: CUBITAL TUNNEL RELEASE;  Surgeon: Vickki Hearing, MD;  Location: [facility];  Service: Orthopedics;  Laterality: Right;  . WISDOM TOOTH EXTRACTION      There were no vitals filed for this visit.      Subjective Assessment - 12/16/16 0945    Subjective  S: I'll admit I've been slack this week with my exercises.    Currently in Pain? Yes   Pain Score 1    Pain Location Shoulder   Pain Orientation Right   Pain Descriptors / Indicators Sore   Pain Type Acute pain   Pain Radiating Towards none   Pain Onset 1 to 4 weeks ago   Pain Frequency Intermittent   Aggravating Factors  movement   Pain Relieving Factors heat, aleve   Effect of Pain on Daily Activities unable to use RUE for daily tasks   Multiple Pain Sites No            [facility] OT  Assessment - 12/16/16 0945      Assessment   Diagnosis R RCR     Precautions   Precautions Shoulder   Type of Shoulder Precautions P/ROM 4/13-5/11. AA/ROM and progress as tolerated.     Shoulder Interventions Shoulder sling/immobilizer;At all times     Palpation   Palpation comment Moderate fascial restrictions in right upper arm and scapularis regions.      AROM   Overall AROM  Unable to assess;Due to precautions   AROM Assessment Site Shoulder   Right/Left Shoulder Right     PROM   Overall PROM Comments Assessed supine, er/IR adducted   PROM Assessment Site Shoulder   Right/Left Shoulder Right   Right Shoulder Flexion 150 Degrees  105 previous   Right Shoulder ABduction 180  Degrees  81 previous   Right Shoulder Internal Rotation 90 Degrees  same as previous   Right Shoulder External Rotation 25 Degrees  12 previous     Strength   Overall Strength Unable to assess;Due to precautions   Strength Assessment Site Shoulder   Right/Left Shoulder Right                  OT Treatments/Exercises (OP) - 12/16/16 0949      Exercises   Exercises Shoulder     Shoulder Exercises: Supine   Protraction PROM;10 reps   Horizontal ABduction PROM;10 reps   External Rotation PROM;10 reps   Internal Rotation PROM;10 reps   Flexion PROM;10 reps   ABduction PROM;10 reps     Shoulder Exercises: Seated   Elevation AROM;15 reps   Extension AROM;15 reps   Row AROM;15 reps     Shoulder Exercises: Therapy Ball   Flexion 10 reps   ABduction 10 reps     Shoulder Exercises: ROM/Strengthening   Thumb Tacks 1'low level    Anterior Glide 3X5"   Caudal Glide 3X5"     Shoulder Exercises: Isometric Strengthening   Flexion 3X5"  standing   Extension 3X5"  standing   External Rotation 3X5"  standing   Internal Rotation 3X5"  standing   ABduction 3X5"  standing   ADduction 3X5"  standing     Manual Therapy   Manual Therapy Myofascial release   Manual therapy comments completed separately from therapeutic exercises   Myofascial Release myofascial release to right upper arm, trapezius, and scapularis regions to decrease pain and fascial restrictions and increase joint mobility                  OT Short Term Goals - 12/16/16 1130      OT SHORT TERM GOAL #1   Title Pt will be educated on HEP for improved mobility of RUE during daily tasks.    Time 6   Period Weeks   Status On-going     OT SHORT TERM GOAL #2   Title Pt will decrease RUE fascial restrictions from mod to minimal amounts to improve mobility needed for overhead reaching.    Time 6   Period Weeks   Status On-going     OT SHORT TERM GOAL #3   Title Pt will decrease pain in RUE  to 4/10 to improve ability to use RUE as assist during daily tasks.    Time 6   Period Weeks   Status Achieved     OT SHORT TERM GOAL #4   Title Pt will improve P/ROM of RUE to WNL to improve ability to donn shirts.    Time 6  Period Weeks   Status On-going     OT SHORT TERM GOAL #5   Title Pt will improve RUE strength to 3+/5 to improve ability to reach items at shoulder height.    Time 6   Period Weeks   Status On-going           OT Long Term Goals - 12/02/16 1206      OT LONG TERM GOAL #1   Title Pt will return to highest level of functioning and independence in ADL and work tasks using RUE as assist.    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    Time 12   Period Weeks   Status On-going     OT LONG TERM GOAL #3   Title Pt will decrease RUE pain to 2/10 or less to improve ability to sleep in comfortable position at night.    Time 12   Period Weeks   Status On-going     OT LONG TERM GOAL #4   Title Pt will increase RUE A/ROM to [facility] to improve ability to perform overhead reaching tasks.    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    Time 12   Period Weeks   Status On-going               Plan - 12/16/16 1128    Clinical Impression Statement A: Reassessment completed this session as pt has MD appt on 5/8. Pt has made improvements in ROM and pain, meeting 1 STG. Continued with protocol this session, adding low thumb tacks, pt able to complete with initial cuing for form. Reports he did not complete HEP daily, but did complete exercises a few times since last session.    Plan P: Follow up on HEP. Progress to AA/ROM.       Patient will benefit from skilled  therapeutic intervention in order to improve the following deficits and impairments:  Impaired flexibility, Decreased strength, Decreased activity tolerance, Pain, Decreased range of motion, Increased fascial restricitons, Impaired UE functional use  Visit Diagnosis: Acute pain of right shoulder  Stiffness of right shoulder, not elsewhere classified  Other symptoms and signs involving the musculoskeletal system    Problem List Patient Active Problem List   Diagnosis Date Noted  . Complete tear of right rotator cuff   . Cubital tunnel syndrome on right    Ezra Sites, OTR/L  [id number] 12/16/2016, 11:32 AM  [facility] [address] [place], [place], [address] Phone: [phone]   Fax:  [phone]  Name: Jesus Ingram MRN: [id number] Date of Birth: 1960/08/17

## 2016-12-20 ENCOUNTER — Encounter: Payer: Self-pay | Admitting: Orthopedic Surgery

## 2016-12-20 ENCOUNTER — Ambulatory Visit (INDEPENDENT_AMBULATORY_CARE_PROVIDER_SITE_OTHER): Payer: 59 | Admitting: Orthopedic Surgery

## 2016-12-20 DIAGNOSIS — Z4889 Encounter for other specified surgical aftercare: Secondary | ICD-10-CM

## 2016-12-20 DIAGNOSIS — Z9889 Other specified postprocedural states: Secondary | ICD-10-CM

## 2016-12-20 NOTE — Progress Notes (Signed)
Chief Complaint  Patient presents with  . Follow-up    RT RCR, DOS 11/03/16   47 DAYS POST OP Rotator cuff repair right shoulder. Patient doing well progressing well in therapy PT notes look good  Recommend continue therapy follow-up in 5 weeks  Current pain medicine Aleve once or twice per week  Progressed therapy as per protocol  Encounter Diagnoses  Name Primary?  Marland Kitchen. Aftercare following surgery Yes  . S/P right rotator cuff repair

## 2016-12-23 ENCOUNTER — Encounter (HOSPITAL_COMMUNITY): Payer: Self-pay | Admitting: Occupational Therapy

## 2016-12-23 ENCOUNTER — Ambulatory Visit (HOSPITAL_COMMUNITY): Payer: 59 | Admitting: Occupational Therapy

## 2016-12-23 DIAGNOSIS — M25611 Stiffness of right shoulder, not elsewhere classified: Secondary | ICD-10-CM

## 2016-12-23 DIAGNOSIS — R29898 Other symptoms and signs involving the musculoskeletal system: Secondary | ICD-10-CM

## 2016-12-23 DIAGNOSIS — M25511 Pain in right shoulder: Secondary | ICD-10-CM | POA: Diagnosis not present

## 2016-12-23 NOTE — Patient Instructions (Signed)
Perform each exercise ___10_____ reps. 2-3x days.   Protraction  Start by holding a wand or cane at chest height.  Next, slowly push the wand outwards in front of your body so that your elbows become fully straightened. Then, return to the original position.     Shoulder FLEXION  In the standing position, hold a wand/cane with both arms, palms up on both sides. Raise up the wand/cane allowing your unaffected arm to perform most of the effort. Your affected arm should be partially relaxed.      Internal/External ROTATION   In the standing position, hold a wand/cane with both hands keeping your elbows bent. Move your arms and wand/cane to one side.  Your affected arm should be partially relaxed while your unaffected arm performs most of the effort.       Shoulder ABDUCTION   While holding a wand/cane palm face up on the injured side and palm face down on the uninjured side, slowly raise up your injured arm to the side.                     Horizontal Abduction/Adduction      Straight arms holding cane at shoulder height, bring cane to right, center, left. Repeat starting to left.   Copyright  [employer]. All rights reserved.       

## 2016-12-23 NOTE — Therapy (Signed)
[facility] [address] [place], [place], [address] Phone: [phone]   Fax:  [phone]  Occupational Therapy Treatment  Patient Details  Name: Jesus Ingram MRN: [id number] Date of Birth: 09-Mar-1961 Referring Provider: Dr. Fuller CanadaStanley Harrison  Encounter Date: 12/23/2016      OT End of Session - 12/23/16 1055    Visit Number 5   Number of Visits 12   Date for OT Re-Evaluation 01/24/17   Authorization Type [employer]   Authorization Time Period $30 copay; at 40th visit medical record review required   Authorization - Visit Number 5   Authorization - Number of Visits 40   OT Start Time [phone]   OT Stop Time 1029   OT Time Calculation (min) 41 min   Activity Tolerance Patient tolerated treatment well   Behavior [facility] for tasks assessed/performed      Past Medical History:  Diagnosis Date  . Arthritis   . Borderline diabetes   . Chronic back pain   . Diabetes mellitus without complication (HCC)   . GERD (gastroesophageal reflux disease)   . History of kidney stones    history of kidney stones.  . History of renal calculi   . Reflux    occasional, uses TUMS  . Restless leg syndrome   . Ruptured disc, thoracic   . Seasonal allergies   . Sleep apnea    lost weight and has gotten better    Past Surgical History:  Procedure Laterality Date  . CATARACT EXTRACTION W/PHACO  01/10/2012   Procedure: CATARACT EXTRACTION PHACO AND INTRAOCULAR LENS PLACEMENT (IOC);  Surgeon: Loraine LericheMark T. Nile RiggsShapiro, MD;  Location: [facility];  Service: Ophthalmology;  Laterality: Right;  CDE 9.77  . CATARACT EXTRACTION W/PHACO Left 01/13/2015   Procedure: CATARACT EXTRACTION PHACO AND INTRAOCULAR LENS PLACEMENT (IOC);  Surgeon: Jethro BolusMark Shapiro, MD;  Location: [facility];  Service: Ophthalmology;  Laterality: Left;  CDE:3.08  . COLONOSCOPY N/A 04/25/2013   Procedure: COLONOSCOPY;  Surgeon: Malissa HippoNajeeb U Rehman, MD;  Location: [facility];  Service:  Endoscopy;  Laterality: N/A;  830  . CYSTOSCOPY/RETROGRADE/URETEROSCOPY/STONE EXTRACTION WITH BASKET  2011   with stents, [facility]; [place]  . ELBOW ARTHROSCOPY W/ SYNOVECTOMY    . LITHOTRIPSY    . SHOULDER ARTHROSCOPY WITH ROTATOR CUFF REPAIR Right 11/03/2016   Procedure: SHOULDER ARTHROSCOPY WITH ROTATOR CUFF REPAIR;  Surgeon: Vickki HearingStanley E Harrison, MD;  Location: [facility];  Service: Orthopedics;  Laterality: Right;  . ULNAR TUNNEL RELEASE Right 07/14/2016   Procedure: CUBITAL TUNNEL RELEASE;  Surgeon: Vickki HearingStanley E Harrison, MD;  Location: [facility];  Service: Orthopedics;  Laterality: Right;  . WISDOM TOOTH EXTRACTION      There were no vitals filed for this visit.      Subjective Assessment - 12/23/16 0948    Subjective  S: I was opening a door and the wind jerked my arm yesterday.   Currently in Pain? Yes   Pain Score 2    Pain Location Shoulder   Pain Orientation Right   Pain Descriptors / Indicators Aching;Sore   Pain Type Acute pain   Pain Radiating Towards none   Pain Onset More than a month ago   Pain Frequency Intermittent   Aggravating Factors  movement   Pain Relieving Factors heat, aleve   Effect of Pain on Daily Activities mod effect on RUE use for daily tasks   Multiple Pain Sites No  [facility] OT Assessment - 12/23/16 0948      Assessment   Diagnosis R RCR     Precautions   Precautions Shoulder   Type of Shoulder Precautions P/ROM 4/13-5/11. AA/ROM and progress as tolerated.     Shoulder Interventions Shoulder sling/immobilizer;At all times                  OT Treatments/Exercises (OP) - 12/23/16 1610      Exercises   Exercises Shoulder     Shoulder Exercises: Supine   Protraction PROM;AAROM;10 reps   Horizontal ABduction PROM;AAROM;10 reps   External Rotation PROM;AAROM;10 reps   Internal Rotation PROM;AAROM;10 reps   Flexion PROM;AAROM;10 reps   ABduction PROM;AAROM;10 reps     Shoulder Exercises: Standing   Protraction AAROM;5 reps    Horizontal ABduction AAROM;5 reps   External Rotation AAROM;5 reps   Internal Rotation AAROM;5 reps   Flexion AAROM;5 reps   ABduction AAROM;5 reps     Shoulder Exercises: ROM/Strengthening   Thumb Tacks 1'     Manual Therapy   Manual Therapy Myofascial release   Manual therapy comments completed separately from therapeutic exercises   Myofascial Release myofascial release to right upper arm, trapezius, and scapularis regions to decrease pain and fascial restrictions and increase joint mobility                OT Education - 12/23/16 1019    Education provided Yes   Education Details AA/ROM exercises   Person(s) Educated Patient   Methods Explanation;Demonstration;Handout   Comprehension Verbalized understanding;Returned demonstration          OT Short Term Goals - 12/16/16 1130      OT SHORT TERM GOAL #1   Title Pt will be educated on HEP for improved mobility of RUE during daily tasks.    Time 6   Period Weeks   Status On-going     OT SHORT TERM GOAL #2   Title Pt will decrease RUE fascial restrictions from mod to minimal amounts to improve mobility needed for overhead reaching.    Time 6   Period Weeks   Status On-going     OT SHORT TERM GOAL #3   Title Pt will decrease pain in RUE to 4/10 to improve ability to use RUE as assist during daily tasks.    Time 6   Period Weeks   Status Achieved     OT SHORT TERM GOAL #4   Title Pt will improve P/ROM of RUE to WNL to improve ability to donn shirts.    Time 6   Period Weeks   Status On-going     OT SHORT TERM GOAL #5   Title Pt will improve RUE strength to 3+/5 to improve ability to reach items at shoulder height.    Time 6   Period Weeks   Status On-going           OT Long Term Goals - 12/02/16 1206      OT LONG TERM GOAL #1   Title Pt will return to highest level of functioning and independence in ADL and work tasks using RUE as assist.    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    Time 12   Period Weeks   Status On-going  OT LONG TERM GOAL #3   Title Pt will decrease RUE pain to 2/10 or less to improve ability to sleep in comfortable position at night.    Time 12   Period Weeks   Status On-going     OT LONG TERM GOAL #4   Title Pt will increase RUE A/ROM to [facility] to improve ability to perform overhead reaching tasks.    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    Time 12   Period Weeks   Status On-going               Plan - 12/23/16 1056    Clinical Impression Statement A: Pt reports MD is pleased with progress, ready for phase II. Progressed to AA/ROM, pt requiring intermittent cuing for initial form and technique. Pt provided with HEP, instructions to discontinue previously provided exercises and focus on AA/ROM.    Plan P: continue with AA/ROM, follow up on HEP, add wall wash    Consulted and Agree with Plan of Care Patient      Patient will benefit from skilled therapeutic intervention in order to improve the following deficits and impairments:  Impaired flexibility, Decreased strength, Decreased activity tolerance, Pain, Decreased range of motion, Increased fascial restricitons, Impaired UE functional use  Visit Diagnosis: Acute pain of right shoulder  Stiffness of right shoulder, not elsewhere classified  Other symptoms and signs involving the musculoskeletal system    Problem List Patient Active Problem List   Diagnosis Date Noted  . Complete tear of right rotator cuff   . Cubital tunnel syndrome on right    Ezra Sites, OTR/L  [phone] 12/23/2016, 11:00 AM  [facility] [address]  [place], [place], [address] Phone: [phone]   Fax:  [phone]  Name: Jesus Ingram MRN: [id number] Date of Birth: 07/29/1961

## 2016-12-25 NOTE — Procedures (Signed)
Shepherd A. Merlene Laughter, MD     [email or web address]             NOCTURNAL POLYSOMNOGRAPHY   LOCATION: [facility]   Patient Name: Jesus Ingram, Jesus Ingram Date: 12/16/2016 Gender: Male D.O.B: [birth year] Age (years): 56 Referring Provider: Lennox Solders Height (inches): 72 Interpreting Physician: Phillips Odor MD, ABSM Weight (lbs): 223 RPSGT: Peak, Robert BMI: 30 MRN: [id number] Neck Size: 18.00 CLINICAL INFORMATION Sleep Study Type: Split Night CPAP  Indication for sleep study: N/A  Epworth Sleepiness Score: 8  SLEEP STUDY TECHNIQUE As per the AASM Manual for the Scoring of Sleep and Associated Events v2.3 (April 2016) with a hypopnea requiring 4% desaturations.  The channels recorded and monitored were frontal, central and occipital EEG, electrooculogram (EOG), submentalis EMG (chin), nasal and oral airflow, thoracic and abdominal wall motion, anterior tibialis EMG, snore microphone, electrocardiogram, and pulse oximetry. Continuous positive airway pressure (CPAP) was initiated when the patient met split night criteria and was titrated according to treat sleep-disordered breathing.  MEDICATIONS Medications self-administered by patient taken the night of the study : N/A   Current Outpatient Prescriptions:  .  acetaminophen-codeine (TYLENOL #3) 300-30 MG tablet, Take 1 tablet by mouth every 4 (four) hours as needed for moderate pain. , Disp: , Rfl: 0 .  calcium carbonate (TUMS - DOSED IN MG ELEMENTAL CALCIUM) 500 MG chewable tablet, Chew 2 tablets by mouth daily as needed for indigestion or heartburn. , Disp: , Rfl:  .  HYDROcodone-acetaminophen (NORCO) 7.5-325 MG tablet, Take 1 tablet by mouth every 4 (four) hours as needed for moderate pain., Disp: 42 tablet, Rfl: 0 .  ibuprofen (ADVIL,MOTRIN) 800 MG tablet, Take 1 tablet (800 mg total) by mouth every 8 (eight) hours as needed., Disp: 90 tablet, Rfl: 1 .  metFORMIN (GLUCOPHAGE) 500 MG tablet, Take 500  mg by mouth every evening. , Disp: , Rfl: 3 .  methocarbamol (ROBAXIN) 500 MG tablet, Take 1 tablet (500 mg total) by mouth 4 (four) times daily., Disp: 60 tablet, Rfl: 1 .  nabumetone (RELAFEN) 750 MG tablet, Take 750 mg by mouth daily as needed for moderate pain. , Disp: , Rfl:  .  promethazine (PHENERGAN) 12.5 MG tablet, Take 1 tablet (12.5 mg total) by mouth every 6 (six) hours as needed for nausea or vomiting., Disp: 30 tablet, Rfl: 0 .  rOPINIRole (REQUIP) 0.5 MG tablet, Take 0.5 mg by mouth at bedtime as needed (restless legs). , Disp: , Rfl:    RESPIRATORY PARAMETERS Diagnostic  Total AHI (/hr): 65.5 RDI (/hr): 69.2 OA Index (/hr): 21.8 CA Index (/hr): 8.5 REM AHI (/hr): N/A NREM AHI (/hr): 65.5 Supine AHI (/hr): 85.2 Non-supine AHI (/hr): 52.55 Min O2 Sat (%): 78.00 Mean O2 (%): 91.94 Time below 88% (min): 14.9   Titration  Optimal Pressure (cm): 11 AHI at Optimal Pressure (/hr): 0.8 Min O2 at Optimal Pressure (%): 90.0 Supine % at Optimal (%): 100 Sleep % at Optimal (%): 98   SLEEP ARCHITECTURE The recording time for the entire night was 384.7 minutes.  During a baseline period of 183.4 minutes, the patient slept for 126.5 minutes in REM and nonREM, yielding a sleep efficiency of 69.0%. Sleep onset after lights out was 4.9 minutes with a REM latency of N/A minutes. The patient spent 19.37% of the night in stage N1 sleep, 79.84% in stage N2 sleep, 0.79% in stage N3 and 0.00% in REM.  During the titration period of 198.8 minutes, the patient slept for 181.8 minutes  in REM and nonREM, yielding a sleep efficiency of 91.5%. Sleep onset after CPAP initiation was 7.5 minutes with a REM latency of 38.5 minutes. The patient spent 1.10% of the night in stage N1 sleep, 53.81% in stage N2 sleep, 14.85% in stage N3 and 30.25% in REM.  CARDIAC DATA The 2 lead EKG demonstrated sinus rhythm. The mean heart rate was 68.58 beats per minute. Other EKG findings include: None. LEG MOVEMENT DATA The  total Periodic Limb Movements of Sleep (PLMS) were 0. The PLMS index was 0.00.  IMPRESSIONS - Severe obstructive sleep apnea occurred during the diagnostic portion of the study (AHI = 65.5/hour). An optimal CPAP is selected for this patient ( 11 cm of water).  Delano Metz, MD Diplomate, American Board of Sleep Medicine.    ELECTRONICALLY SIGNED ON:  12/25/2016, 10:49 PM Bernie PH: [phone]   FX: [phone] [place]

## 2016-12-30 ENCOUNTER — Ambulatory Visit (HOSPITAL_COMMUNITY): Payer: 59 | Admitting: Occupational Therapy

## 2016-12-30 ENCOUNTER — Encounter (HOSPITAL_COMMUNITY): Payer: Self-pay | Admitting: Occupational Therapy

## 2016-12-30 DIAGNOSIS — M25511 Pain in right shoulder: Secondary | ICD-10-CM | POA: Diagnosis not present

## 2016-12-30 DIAGNOSIS — R29898 Other symptoms and signs involving the musculoskeletal system: Secondary | ICD-10-CM

## 2016-12-30 DIAGNOSIS — M25611 Stiffness of right shoulder, not elsewhere classified: Secondary | ICD-10-CM

## 2016-12-30 NOTE — Therapy (Signed)
[place], [place], [address] Phone: [phone]   Fax:  [phone]  Occupational Therapy Treatment  Patient Details  Name: Jesus Ingram MRN: [id number] Date of Birth: 1961/04/11 Referring Provider: Dr. Arther Abbott  Encounter Date: 12/30/2016      OT End of Session - 12/30/16 1033    Visit Number 6   Number of Visits 12   Date for OT Re-Evaluation 01/24/17   Authorization Type [employer]   Authorization Time Period $30 copay; at 40th visit medical record review required   Authorization - Visit Number 6   Authorization - Number of Visits 40   OT Start Time [phone]   OT Stop Time 1031   OT Time Calculation (min) 45 min   Activity Tolerance Patient tolerated treatment well   Behavior During Therapy [facility] for tasks assessed/performed      Past Medical History:  Diagnosis Date  . Arthritis   . Borderline diabetes   . Chronic back pain   . Diabetes mellitus without complication ([place])   . GERD (gastroesophageal reflux disease)   . History of kidney stones    history of kidney stones.  . History of renal calculi   . Reflux    occasional, uses TUMS  . Restless leg syndrome   . Ruptured disc, thoracic   . Seasonal allergies   . Sleep apnea    lost weight and has gotten better    Past Surgical History:  Procedure Laterality Date  . CATARACT EXTRACTION W/PHACO  01/10/2012   Procedure: CATARACT EXTRACTION PHACO AND INTRAOCULAR LENS PLACEMENT (IOC);  Surgeon: Elta Guadeloupe T. Gershon Crane, MD;  Location: [facility];  Service: Ophthalmology;  Laterality: Right;  CDE 9.77  . CATARACT EXTRACTION W/PHACO Left 01/13/2015   Procedure: CATARACT EXTRACTION PHACO AND INTRAOCULAR LENS PLACEMENT (IOC);  Surgeon: Rutherford Guys, MD;  Location: [facility];  Service: Ophthalmology;  Laterality: Left;  CDE:3.08  . COLONOSCOPY N/A 04/25/2013   Procedure: COLONOSCOPY;  Surgeon: Rogene Houston, MD;  [facility]: [facility];  Service:  Endoscopy;  Laterality: N/A;  830  . CYSTOSCOPY/RETROGRADE/URETEROSCOPY/STONE EXTRACTION WITH BASKET  2011   with stents, [place]; [place]  . ELBOW ARTHROSCOPY W/ SYNOVECTOMY    . LITHOTRIPSY    . SHOULDER ARTHROSCOPY WITH ROTATOR CUFF REPAIR Right 11/03/2016   Procedure: SHOULDER ARTHROSCOPY WITH ROTATOR CUFF REPAIR;  Surgeon: Carole Civil, MD;  Location: [facility];  Service: Orthopedics;  Laterality: Right;  . ULNAR TUNNEL RELEASE Right 07/14/2016   Procedure: CUBITAL TUNNEL RELEASE;  Surgeon: Carole Civil, MD;  Location: [facility];  Service: Orthopedics;  Laterality: Right;  . WISDOM TOOTH EXTRACTION      There were no vitals filed for this visit.      Subjective Assessment - 12/30/16 0943    Subjective  S: I'm doing my exercises at least once per day.    Currently in Pain? Yes   Pain Score 3    Pain Location Shoulder   Pain Orientation Right   Pain Descriptors / Indicators Aching;Sore   Pain Type Acute pain   Pain Radiating Towards none   Pain Onset More than a month ago   Pain Frequency Intermittent   Aggravating Factors  movement   Pain Relieving Factors heat, aleve   Effect of Pain on Daily Activities mod effect on RUE use for daily tasks   Multiple Pain Sites No            [facility] OT  Assessment - 12/30/16 0943      Assessment   Diagnosis R RCR     Precautions   Precautions Shoulder   Type of Shoulder Precautions P/ROM 4/13-5/11. AA/ROM and progress as tolerated.     Shoulder Interventions Shoulder sling/immobilizer;At all times                  OT Treatments/Exercises (OP) - 12/30/16 0948      Exercises   Exercises Shoulder     Shoulder Exercises: Supine   Protraction PROM;AAROM;10 reps   Horizontal ABduction PROM;AAROM;10 reps   External Rotation PROM;AAROM;10 reps   Internal Rotation PROM;AAROM;10 reps   Flexion PROM;AAROM;10 reps   ABduction PROM;AAROM;10 reps     Shoulder Exercises: Standing   Protraction AAROM;10 reps   Horizontal  ABduction AAROM;10 reps   External Rotation AAROM;10 reps   Internal Rotation AAROM;10 reps   Flexion AAROM;10 reps   ABduction AAROM;10 reps     Shoulder Exercises: ROM/Strengthening   Wall Wash 1'   Thumb Tacks 1'   Prot/Ret//Elev/Dep 1'     Manual Therapy   Manual Therapy Myofascial release;Muscle Energy Technique   Manual therapy comments completed separately from therapeutic exercises   Myofascial Release myofascial release to right upper arm, trapezius, and scapularis regions to decrease pain and fascial restrictions and increase joint mobility   Muscle Energy Technique Muscle energy technique to decrease muscle spasm and increase range of motion                  OT Short Term Goals - 12/16/16 1130      OT SHORT TERM GOAL #1   Title Pt will be educated on HEP for improved mobility of RUE during daily tasks.    Time 6   Period Weeks   Status On-going     OT SHORT TERM GOAL #2   Title Pt will decrease RUE fascial restrictions from mod to minimal amounts to improve mobility needed for overhead reaching.    Time 6   Period Weeks   Status On-going     OT SHORT TERM GOAL #3   Title Pt will decrease pain in RUE to 4/10 to improve ability to use RUE as assist during daily tasks.    Time 6   Period Weeks   Status Achieved     OT SHORT TERM GOAL #4   Title Pt will improve P/ROM of RUE to WNL to improve ability to donn shirts.    Time 6   Period Weeks   Status On-going     OT SHORT TERM GOAL #5   Title Pt will improve RUE strength to 3+/5 to improve ability to reach items at shoulder height.    Time 6   Period Weeks   Status On-going           OT Long Term Goals - 12/02/16 1206      OT LONG TERM GOAL #1   Title Pt will return to highest level of functioning and independence in ADL and work tasks using RUE as assist.    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    Time 12   Period Weeks   Status On-going     OT LONG TERM GOAL #3   Title Pt  will decrease RUE pain to 2/10 or less to improve ability to sleep in comfortable position at night.    Time 12   Period Weeks   Status On-going     OT LONG TERM GOAL #4   Title Pt will increase RUE A/ROM to [facility] to improve ability to perform overhead reaching tasks.    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    Time 12   Period Weeks   Status On-going               Plan - 12/30/16 1033    Clinical Impression Statement A: Added wall wash, prot/ret/elev/dep, increased AA/ROM standing reps to 10. Pt with good response to muscle energy technique, P/ROM is [facility] today. Verbal cuing for form and technique, and for depressing shoulder.    Plan P: add pulleys, continue to work towards P/ROM WNL   OT Home Exercise Plan 4/13: table slides, pendulums; 4/20: scapular A/ROM; 4/27: shoulder isometrics   Consulted and Agree with Plan of Care Patient      Patient will benefit from skilled therapeutic intervention in order to improve the following deficits and impairments:  Impaired flexibility, Decreased strength, Decreased activity tolerance, Pain, Decreased range of motion, Increased fascial restricitons, Impaired UE functional use  Visit Diagnosis: Acute pain of right shoulder  Stiffness of right shoulder, not elsewhere classified  Other symptoms and signs involving the musculoskeletal system    Problem List Patient Active Problem List   Diagnosis Date Noted  . Complete tear of right rotator cuff   . Cubital tunnel syndrome on right    Guadelupe Sabin, OTR/L  [phone] 12/30/2016, 10:35 AM  [place], [place],  [address] Phone: [phone]   Fax:  [phone]  Name: Jesus Ingram MRN: [id number] Date of Birth: 08-Feb-1961

## 2017-01-06 ENCOUNTER — Ambulatory Visit (HOSPITAL_COMMUNITY): Payer: 59 | Admitting: Occupational Therapy

## 2017-01-06 ENCOUNTER — Encounter (HOSPITAL_COMMUNITY): Payer: Self-pay | Admitting: Occupational Therapy

## 2017-01-06 DIAGNOSIS — M25611 Stiffness of right shoulder, not elsewhere classified: Secondary | ICD-10-CM

## 2017-01-06 DIAGNOSIS — M25511 Pain in right shoulder: Secondary | ICD-10-CM | POA: Diagnosis not present

## 2017-01-06 DIAGNOSIS — R29898 Other symptoms and signs involving the musculoskeletal system: Secondary | ICD-10-CM

## 2017-01-06 DIAGNOSIS — G4733 Obstructive sleep apnea (adult) (pediatric): Secondary | ICD-10-CM | POA: Diagnosis not present

## 2017-01-06 NOTE — Therapy (Signed)
[facility] [address] [place], [place], [address] Phone: [phone]   Fax:  [phone]  Occupational Therapy Treatment  Patient Details  Name: Jesus Ingram MRN: [id number] Date of Birth: 1961-04-24 Referring Provider: Dr. Fuller Canada  Encounter Date: 01/06/2017      OT End of Session - 01/06/17 1041    Visit Number 7   Number of Visits 12   Date for OT Re-Evaluation 01/24/17   Authorization Type [employer]   Authorization Time Period $30 copay; at 40th visit medical record review required   Authorization - Visit Number 7   Authorization - Number of Visits 40   OT Start Time [phone]   OT Stop Time 1034   OT Time Calculation (min) 46 min   Activity Tolerance Patient tolerated treatment well   Behavior During Therapy [facility] for tasks assessed/performed      Past Medical History:  Diagnosis Date  . Arthritis   . Borderline diabetes   . Chronic back pain   . Diabetes mellitus without complication (HCC)   . GERD (gastroesophageal reflux disease)   . History of kidney stones    history of kidney stones.  . History of renal calculi   . Reflux    occasional, uses TUMS  . Restless leg syndrome   . Ruptured disc, thoracic   . Seasonal allergies   . Sleep apnea    lost weight and has gotten better    Past Surgical History:  Procedure Laterality Date  . CATARACT EXTRACTION W/PHACO  01/10/2012   Procedure: CATARACT EXTRACTION PHACO AND INTRAOCULAR LENS PLACEMENT (IOC);  Surgeon: Loraine Leriche T. Nile Riggs, MD;  Location: [facility];  Service: Ophthalmology;  Laterality: Right;  CDE 9.77  . CATARACT EXTRACTION W/PHACO Left 01/13/2015   Procedure: CATARACT EXTRACTION PHACO AND INTRAOCULAR LENS PLACEMENT (IOC);  Surgeon: Jethro Bolus, MD;  Location: [facility];  Service: Ophthalmology;  Laterality: Left;  CDE:3.08  . COLONOSCOPY N/A 04/25/2013   Procedure: COLONOSCOPY;  Surgeon: Malissa Hippo, MD;  Location: [facility];  Service:  Endoscopy;  Laterality: N/A;  830  . CYSTOSCOPY/RETROGRADE/URETEROSCOPY/STONE EXTRACTION WITH BASKET  2011   with stents, [facility]; [facility]  . ELBOW ARTHROSCOPY W/ SYNOVECTOMY    . LITHOTRIPSY    . SHOULDER ARTHROSCOPY WITH ROTATOR CUFF REPAIR Right 11/03/2016   Procedure: SHOULDER ARTHROSCOPY WITH ROTATOR CUFF REPAIR;  Surgeon: Vickki Hearing, MD;  Location: [facility];  Service: Orthopedics;  Laterality: Right;  . ULNAR TUNNEL RELEASE Right 07/14/2016   Procedure: CUBITAL TUNNEL RELEASE;  Surgeon: Vickki Hearing, MD;  Location: [facility];  Service: Orthopedics;  Laterality: Right;  . WISDOM TOOTH EXTRACTION      There were no vitals filed for this visit.      Subjective Assessment - 01/06/17 0950    Subjective  S: I'm trying to figure out what my limitations are.    Currently in Pain? Yes   Pain Score 2    Pain Location Shoulder   Pain Orientation Right   Pain Descriptors / Indicators Aching;Sore   Pain Type Acute pain   Pain Radiating Towards none   Pain Onset More than a month ago   Pain Frequency Intermittent   Aggravating Factors  movement   Pain Relieving Factors heat, aleve   Effect of Pain on Daily Activities mod effect on RUE use for daily tasks   Multiple Pain Sites No            [facility] OT  Assessment - 01/06/17 0949      Assessment   Diagnosis R RCR     Precautions   Precautions Shoulder   Type of Shoulder Precautions P/ROM 4/13-5/11. AA/ROM and progress as tolerated.     Shoulder Interventions Shoulder sling/immobilizer;At all times                  OT Treatments/Exercises (OP) - 01/06/17 0951      Exercises   Exercises Shoulder     Shoulder Exercises: Supine   Protraction PROM;5 reps;AAROM;12 reps   Horizontal ABduction PROM;5 reps;AAROM;12 reps   External Rotation PROM;5 reps;AAROM;12 reps   Internal Rotation PROM;5 reps;AAROM;12 reps   Flexion PROM;5 reps;AAROM;12 reps   ABduction PROM;5 reps;AAROM;12 reps     Shoulder Exercises:  Standing   Protraction AAROM;12 reps   Horizontal ABduction AAROM;12 reps   External Rotation AAROM;12 reps   Internal Rotation AAROM;12 reps   Flexion AAROM;12 reps   ABduction AAROM;12 reps     Shoulder Exercises: Pulleys   Flexion 1 minute   ABduction 1 minute     Manual Therapy   Manual Therapy Myofascial release;Muscle Energy Technique   Manual therapy comments completed separately from therapeutic exercises   Myofascial Release myofascial release to right upper arm, trapezius, and scapularis regions to decrease pain and fascial restrictions and increase joint mobility   Muscle Energy Technique Muscle energy technique to decrease muscle spasm and increase range of motion                  OT Short Term Goals - 12/16/16 1130      OT SHORT TERM GOAL #1   Title Pt will be educated on HEP for improved mobility of RUE during daily tasks.    Time 6   Period Weeks   Status On-going     OT SHORT TERM GOAL #2   Title Pt will decrease RUE fascial restrictions from mod to minimal amounts to improve mobility needed for overhead reaching.    Time 6   Period Weeks   Status On-going     OT SHORT TERM GOAL #3   Title Pt will decrease pain in RUE to 4/10 to improve ability to use RUE as assist during daily tasks.    Time 6   Period Weeks   Status Achieved     OT SHORT TERM GOAL #4   Title Pt will improve P/ROM of RUE to WNL to improve ability to donn shirts.    Time 6   Period Weeks   Status On-going     OT SHORT TERM GOAL #5   Title Pt will improve RUE strength to 3+/5 to improve ability to reach items at shoulder height.    Time 6   Period Weeks   Status On-going           OT Long Term Goals - 12/02/16 1206      OT LONG TERM GOAL #1   Title Pt will return to highest level of functioning and independence in ADL and work tasks using RUE as assist.    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    Time 12   Period Weeks   Status On-going     OT LONG TERM GOAL #3  Title Pt will decrease RUE pain to 2/10 or less to improve ability to sleep in comfortable position at night.    Time 12   Period Weeks   Status On-going     OT LONG TERM GOAL #4   Title Pt will [facility] to improve ability to perform overhead reaching tasks.    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    Time 12   Period Weeks   Status On-going               Plan - 01/06/17 1041    Clinical Impression Statement A: Added pulleys, increased AA/ROM repetitions to 12, pt requires intermittent cuing for form. Noted difficulty achieving full AA/ROM abduction in standing, improvement in depressing shoulder today. Pt reports he is completing HEP at least 1x/day.    Plan P: Add flexion and er wall stretch   OT Home Exercise Plan 4/13: table slides, pendulums; 4/20: scapular A/ROM; 4/27: shoulder isometrics   Consulted and Agree with Plan of Care Patient      Patient will benefit from skilled therapeutic intervention in order to improve the following deficits and impairments:  Impaired flexibility, Decreased strength, Decreased activity tolerance, Pain, Decreased range of motion, Increased fascial restricitons, Impaired UE functional use  Visit Diagnosis: Acute pain of right shoulder  Stiffness of right shoulder, not elsewhere classified  Other symptoms and signs involving the musculoskeletal system    Problem List Patient Active Problem List   Diagnosis Date Noted  . Complete tear of right rotator cuff   . Cubital tunnel syndrome on right    Ezra SitesLeslie Lashelle Koy, OTR/L  [phone] 01/06/2017, 10:51 AM  [facility] [address] [place], [place], [address] Phone: [phone]   Fax:  [phone]  Name: Jesus Ingram MRN: [id number] Date of Birth: 07-Apr-1961

## 2017-01-13 ENCOUNTER — Ambulatory Visit (HOSPITAL_COMMUNITY): Payer: 59 | Attending: Orthopedic Surgery | Admitting: Occupational Therapy

## 2017-01-13 ENCOUNTER — Encounter (HOSPITAL_COMMUNITY): Payer: Self-pay | Admitting: Occupational Therapy

## 2017-01-13 DIAGNOSIS — M25511 Pain in right shoulder: Secondary | ICD-10-CM | POA: Diagnosis present

## 2017-01-13 DIAGNOSIS — M25611 Stiffness of right shoulder, not elsewhere classified: Secondary | ICD-10-CM | POA: Insufficient documentation

## 2017-01-13 DIAGNOSIS — R29898 Other symptoms and signs involving the musculoskeletal system: Secondary | ICD-10-CM | POA: Diagnosis present

## 2017-01-13 NOTE — Therapy (Signed)
[facility] [address] [place], [place], [address] Phone: [phone]   Fax:  [phone]  Occupational Therapy Treatment  Patient Details  Name: Jesus FillJeffrey N Eppinger MRN: [id number] Date of Birth: 1961-03-05 Referring Provider: Dr. Fuller CanadaStanley [place]  Encounter Date: 01/13/2017      OT End of Session - 01/13/17 1049    Visit Number 8   Number of Visits 12   Date for OT Re-Evaluation 01/24/17   Authorization Type [employer]   Authorization Time Period $30 copay; at 40th visit medical record review required   Authorization - Visit Number 8   Authorization - Number of Visits 40   OT Start Time [phone]   OT Stop Time 1031   OT Time Calculation (min) 45 min   Activity Tolerance Patient tolerated treatment well   Behavior [facility] for tasks assessed/performed      Past Medical History:  Diagnosis Date  . Arthritis   . Borderline diabetes   . Chronic back pain   . Diabetes mellitus without complication (HCC)   . GERD (gastroesophageal reflux disease)   . History of kidney stones    history of kidney stones.  . History of renal calculi   . Reflux    occasional, uses TUMS  . Restless leg syndrome   . Ruptured disc, thoracic   . Seasonal allergies   . Sleep apnea    lost weight and has gotten better    Past Surgical History:  Procedure Laterality Date  . CATARACT EXTRACTION W/PHACO  01/10/2012   Procedure: CATARACT EXTRACTION PHACO AND INTRAOCULAR LENS PLACEMENT (IOC);  Surgeon: Loraine LericheMark T. Nile RiggsShapiro, MD;  Location: [facility];  Service: Ophthalmology;  Laterality: Right;  CDE 9.77  . CATARACT EXTRACTION W/PHACO Left 01/13/2015   Procedure: CATARACT EXTRACTION PHACO AND INTRAOCULAR LENS PLACEMENT (IOC);  Surgeon: Jethro BolusMark Shapiro, MD;  Location: [facility];  Service: Ophthalmology;  Laterality: Left;  CDE:3.08  . COLONOSCOPY N/A 04/25/2013   Procedure: COLONOSCOPY;  Surgeon: Malissa HippoNajeeb U Rehman, MD;  [facility];  Service:  Endoscopy;  Laterality: N/A;  830  . CYSTOSCOPY/RETROGRADE/URETEROSCOPY/STONE EXTRACTION WITH BASKET  2011   with stents, [facility]; [facility]  . ELBOW ARTHROSCOPY W/ SYNOVECTOMY    . LITHOTRIPSY    . SHOULDER ARTHROSCOPY WITH ROTATOR CUFF REPAIR Right 11/03/2016   Procedure: SHOULDER ARTHROSCOPY WITH ROTATOR CUFF REPAIR;  Surgeon: Vickki HearingStanley E [place], MD;  Location: [facility];  Service: Orthopedics;  Laterality: Right;  . ULNAR TUNNEL RELEASE Right 07/14/2016   Procedure: CUBITAL TUNNEL RELEASE;  Surgeon: Vickki HearingStanley E [place], MD;  Location: [facility];  Service: Orthopedics;  Laterality: Right;  . WISDOM TOOTH EXTRACTION      There were no vitals filed for this visit.      Subjective Assessment - 01/13/17 0949    Subjective  S: I've been more sore than usual the last couple of days.    Currently in Pain? Yes   Pain Score 4    Pain Location Shoulder   Pain Orientation Right   Pain Descriptors / Indicators Aching;Sore   Pain Type Acute pain   Pain Radiating Towards none   Pain Onset More than a month ago   Pain Frequency Intermittent   Aggravating Factors  movement   Pain Relieving Factors heat, aleve   Effect of Pain on Daily Activities mod effect on RUE use for daily tasks   Multiple Pain Sites No  [facility] OT Assessment - 01/13/17 1011      Assessment   Diagnosis R RCR     Precautions   Precautions Shoulder   Type of Shoulder Precautions P/ROM 4/13-5/11. AA/ROM and progress as tolerated.     Shoulder Interventions Shoulder sling/immobilizer;At all times                  OT Treatments/Exercises (OP) - 01/13/17 0951      Exercises   Exercises Shoulder     Shoulder Exercises: Supine   Protraction PROM;5 reps;AAROM;12 reps   Horizontal ABduction PROM;5 reps;AAROM;12 reps   External Rotation PROM;5 reps;AAROM;12 reps   Internal Rotation PROM;5 reps;AAROM;12 reps   Flexion PROM;5 reps;AAROM;12 reps   ABduction PROM;5 reps;AAROM;12 reps     Shoulder Exercises:  Standing   Protraction AAROM;12 reps   Horizontal ABduction AAROM;12 reps   External Rotation AAROM;12 reps   Internal Rotation AAROM;12 reps   Flexion AAROM;12 reps   ABduction AAROM;12 reps     Shoulder Exercises: Pulleys   Flexion 1 minute   ABduction 1 minute     Shoulder Exercises: ROM/Strengthening   Wall Wash 1'   Thumb Tacks 1'   Proximal Shoulder Strengthening, Supine 10X each no rest breaks     Shoulder Exercises: Stretch   External Rotation Stretch 2 reps;10 seconds   Wall Stretch - Flexion 2 reps;10 seconds     Manual Therapy   Manual Therapy Myofascial release;Muscle Energy Technique   Manual therapy comments completed separately from therapeutic exercises   Myofascial Release myofascial release to right upper arm, trapezius, and scapularis regions to decrease pain and fascial restrictions and increase joint mobility   Muscle Energy Technique Muscle energy technique to decrease muscle spasm and increase range of motion                  OT Short Term Goals - 12/16/16 1130      OT SHORT TERM GOAL #1   Title Pt will be educated on HEP for improved mobility of RUE during daily tasks.    Time 6   Period Weeks   Status On-going     OT SHORT TERM GOAL #2   Title Pt will decrease RUE fascial restrictions from mod to minimal amounts to improve mobility needed for overhead reaching.    Time 6   Period Weeks   Status On-going     OT SHORT TERM GOAL #3   Title Pt will decrease pain in RUE to 4/10 to improve ability to use RUE as assist during daily tasks.    Time 6   Period Weeks   Status Achieved     OT SHORT TERM GOAL #4   Title Pt will improve P/ROM of RUE to WNL to improve ability to donn shirts.    Time 6   Period Weeks   Status On-going     OT SHORT TERM GOAL #5   Title Pt will improve RUE strength to 3+/5 to improve ability to reach items at shoulder height.    Time 6   Period Weeks   Status On-going           OT Long Term Goals -  12/02/16 1206      OT LONG TERM GOAL #1   Title Pt will return to highest level of functioning and independence in ADL and work tasks using RUE as assist.    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    Time 12   Period Weeks   Status On-going     OT LONG TERM GOAL #3   Title Pt will decrease RUE pain to 2/10 or less to improve ability to sleep in comfortable position at night.    Time 12   Period Weeks   Status On-going     OT LONG TERM GOAL #4   Title Pt will increase RUE A/ROM to [facility] to improve ability to perform overhead reaching tasks.    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    Time 12   Period Weeks   Status On-going               Plan - 01/13/17 1049    Clinical Impression Statement A: Added wall stretch flexion and er, pt with AA/ROM and P/ROM [facility] this session. Intermittent verbal cuing for form and technique. Pt reports increased soreness along upper arm for past two days, unsure what caused the increase. Completing exercises daily.    Plan P: Attempt A/ROM in supine, provide shoulder stretching HEP   OT Home Exercise Plan 4/13: table slides, pendulums; 4/20: scapular A/ROM; 4/27: shoulder isometrics   Consulted and Agree with Plan of Care Patient      Patient will benefit from skilled therapeutic intervention in order to improve the following deficits and impairments:  Impaired flexibility, Decreased strength, Decreased activity tolerance, Pain, Decreased range of motion, Increased fascial restricitons, Impaired UE functional use  Visit Diagnosis: Acute pain of right shoulder  Stiffness of right shoulder, not elsewhere classified  Other symptoms and signs  involving the musculoskeletal system    Problem List Patient Active Problem List   Diagnosis Date Noted  . Complete tear of right rotator cuff   . Cubital tunnel syndrome on right    Ezra Sites, OTR/L  [phone] 01/13/2017, 10:51 AM  [facility] [address] [place], [place], [address] Phone: [phone]   Fax:  [phone]  Name: GOTHAM RADEN MRN: [id number] Date of Birth: [birth year]

## 2017-01-18 DIAGNOSIS — G4733 Obstructive sleep apnea (adult) (pediatric): Secondary | ICD-10-CM | POA: Diagnosis not present

## 2017-01-18 DIAGNOSIS — M1990 Unspecified osteoarthritis, unspecified site: Secondary | ICD-10-CM | POA: Diagnosis not present

## 2017-01-18 DIAGNOSIS — E119 Type 2 diabetes mellitus without complications: Secondary | ICD-10-CM | POA: Diagnosis not present

## 2017-01-19 ENCOUNTER — Ambulatory Visit: Payer: Self-pay | Admitting: Allergy & Immunology

## 2017-01-20 ENCOUNTER — Ambulatory Visit (HOSPITAL_COMMUNITY): Payer: 59 | Admitting: Occupational Therapy

## 2017-01-20 ENCOUNTER — Encounter (HOSPITAL_COMMUNITY): Payer: Self-pay | Admitting: Occupational Therapy

## 2017-01-20 DIAGNOSIS — M25611 Stiffness of right shoulder, not elsewhere classified: Secondary | ICD-10-CM

## 2017-01-20 DIAGNOSIS — M25511 Pain in right shoulder: Secondary | ICD-10-CM | POA: Diagnosis not present

## 2017-01-20 DIAGNOSIS — R29898 Other symptoms and signs involving the musculoskeletal system: Secondary | ICD-10-CM

## 2017-01-20 NOTE — Patient Instructions (Signed)
  1) Flexion Wall Stretch    Face wall, place affected handon wall in front of you. Slide hand up the wall  and lean body in towards the wall. Hold for 10 seconds. Repeat 3-5 times. 1-2 times/day.     2) Towel Stretch with Internal Rotation   Gently pull up your affected arm  behind your back with the assist of a towel, hold for 10 seconds. Repeat 3-5X, complete 1-2X/day.             3) Corner Stretch    Stand at a corner of a wall, place your arms on the walls with elbows bent. Lean into the corner until a stretch is felt along the front of your chest and/or shoulders. Hold for 10 seconds. Repeat 3-5X, 1-2 times/day.    4) Posterior Capsule Stretch    Bring the involved arm across chest. Grasp elbow and pull toward chest until you feel a stretch in the back of the upper arm and shoulder. Hold 10 seconds. Repeat 3-5X. Complete 1-2 times/day.    5) External Rotation Stretch:     Place your affected hand on the wall with the elbow bent and gently turn your body the opposite direction until a stretch is felt. Hold for 10 seconds.  Complete 3-5X, 1-2X/day.

## 2017-01-20 NOTE — Therapy (Signed)
[place] [address] [place], [place], [address] Phone: [phone]   Fax:  [phone]  Occupational Therapy Reassessment and Treatment (recertification)  Patient Details  Name: Jesus Ingram MRN: [id number] Date of Birth: 02-24-1961 Referring Provider: Dr. Arther Abbott  Encounter Date: 01/20/2017      OT End of Session - 01/20/17 1047    Visit Number 9   Number of Visits 12   Date for OT Re-Evaluation 01/24/17  02/17/2017   Authorization Type [employer]   Authorization Time Period $30 copay; at 40th visit medical record review required   Authorization - Visit Number 9   Authorization - Number of Visits 40   OT Start Time [phone]   OT Stop Time 1032   OT Time Calculation (min) 46 min   Activity Tolerance Patient tolerated treatment well   Behavior During Therapy [facility] for tasks assessed/performed      Past Medical History:  Diagnosis Date  . Arthritis   . Borderline diabetes   . Chronic back pain   . Diabetes mellitus without complication ([place])   . GERD (gastroesophageal reflux disease)   . History of kidney stones    history of kidney stones.  . History of renal calculi   . Reflux    occasional, uses TUMS  . Restless leg syndrome   . Ruptured disc, thoracic   . Seasonal allergies   . Sleep apnea    lost weight and has gotten better    Past Surgical History:  Procedure Laterality Date  . CATARACT EXTRACTION W/PHACO  01/10/2012   Procedure: CATARACT EXTRACTION PHACO AND INTRAOCULAR LENS PLACEMENT (IOC);  Surgeon: Elta Guadeloupe T. Gershon Crane, MD;  Location: [facility];  Service: Ophthalmology;  Laterality: Right;  CDE 9.77  . CATARACT EXTRACTION W/PHACO Left 01/13/2015   Procedure: CATARACT EXTRACTION PHACO AND INTRAOCULAR LENS PLACEMENT (IOC);  Surgeon: Rutherford Guys, MD;  Location: [facility];  Service: Ophthalmology;  Laterality: Left;  CDE:3.08  . COLONOSCOPY N/A 04/25/2013   Procedure: COLONOSCOPY;  Surgeon: Rogene Houston,  MD;  Location: [facility];  Service: Endoscopy;  Laterality: N/A;  830  . CYSTOSCOPY/RETROGRADE/URETEROSCOPY/STONE EXTRACTION WITH BASKET  2011   with stents, [place]; [place]  . ELBOW ARTHROSCOPY W/ SYNOVECTOMY    . LITHOTRIPSY    . SHOULDER ARTHROSCOPY WITH ROTATOR CUFF REPAIR Right 11/03/2016   Procedure: SHOULDER ARTHROSCOPY WITH ROTATOR CUFF REPAIR;  Surgeon: Carole Civil, MD;  Location: [facility];  Service: Orthopedics;  Laterality: Right;  . ULNAR TUNNEL RELEASE Right 07/14/2016   Procedure: CUBITAL TUNNEL RELEASE;  Surgeon: Carole Civil, MD;  Location: [facility];  Service: Orthopedics;  Laterality: Right;  . WISDOM TOOTH EXTRACTION      There were no vitals filed for this visit.      Subjective Assessment - 01/20/17 0946    Subjective  S: I noticed I can get the towel behind my back and dry off now.    Currently in Pain? No/denies            [facility] OT Assessment - 01/20/17 0945      Assessment   Diagnosis R RCR     Precautions   Precautions Shoulder   Type of Shoulder Precautions P/ROM 4/13-5/11. AA/ROM and progress as tolerated.     Shoulder Interventions Shoulder sling/immobilizer;At all times     Palpation   Palpation comment Minimal fascial restrictions in right upper arm and scapularis regions.      AROM  Overall AROM Comments Assessed seated, er/IR adducted   Right Shoulder Flexion 135 Degrees  not previously assessed   Right Shoulder ABduction 170 Degrees  not previously assessed   Right Shoulder Internal Rotation 90 Degrees  not previously assessed   Right Shoulder External Rotation 59 Degrees  not previously assessed     PROM   Overall PROM Comments Assessed supine, er/IR adducted   Right Shoulder Flexion 162 Degrees  150 previous   Right Shoulder ABduction 180 Degrees  same as previous   Right Shoulder Internal Rotation 90 Degrees  same as previous   Right Shoulder External Rotation 60 Degrees  25 previous     Strength   Overall  Strength Comments Assessed seated, er/IR adducted   Right Shoulder Flexion 4-/5  not previously assessed   Right Shoulder ABduction 3+/5  not previously assessed   Right Shoulder Internal Rotation 4-/5  not previously assessed   Right Shoulder External Rotation 3+/5  not previously assessed                  OT Treatments/Exercises (OP) - 01/20/17 1009      Exercises   Exercises Shoulder     Shoulder Exercises: Supine   Protraction PROM;5 reps;AROM;10 reps   Horizontal ABduction PROM;5 reps;AROM;10 reps   External Rotation PROM;5 reps;AROM;10 reps   Internal Rotation PROM;5 reps;AROM;10 reps   Flexion PROM;5 reps;AROM;10 reps   ABduction PROM;5 reps;AROM;10 reps     Shoulder Exercises: Standing   Protraction AROM;10 reps   Horizontal ABduction AROM;10 reps   External Rotation AROM;10 reps   Internal Rotation AROM;10 reps   Flexion AROM;10 reps   ABduction AROM;10 reps     Shoulder Exercises: ROM/Strengthening   Proximal Shoulder Strengthening, Supine 10X each no rest breaks   Proximal Shoulder Strengthening, Seated 10X each no rest breaks     Shoulder Exercises: Stretch   Corner Stretch 2 reps;10 seconds   Cross Chest Stretch 2 reps;10 seconds   Internal Rotation Stretch 2 reps  10 seconds   External Rotation Stretch 2 reps;10 seconds   Wall Stretch - Flexion 2 reps;10 seconds     Manual Therapy   Manual Therapy Myofascial release;Muscle Energy Technique   Manual therapy comments completed separately from therapeutic exercises   Myofascial Release myofascial release to right upper arm, trapezius, and scapularis regions to decrease pain and fascial restrictions and increase joint mobility   Muscle Energy Technique Muscle energy technique to decrease muscle spasm and increase range of motion                OT Education - 01/20/17 1020    Education provided Yes   Education Details shoulder stretches   Person(s) Educated Patient   Methods  Explanation;Demonstration;Handout   Comprehension Verbalized understanding;Returned demonstration          OT Short Term Goals - 01/20/17 1047      OT SHORT TERM GOAL #1   Title Pt will be educated on HEP for improved mobility of RUE during daily tasks.    Time 6   Period Weeks   Status On-going     OT SHORT TERM GOAL #2   Title Pt will decrease RUE fascial restrictions from mod to minimal amounts to improve mobility needed for overhead reaching.    Time 6   Period Weeks   Status Achieved     OT SHORT TERM GOAL #3   Title Pt will decrease pain in RUE to 4/10 to improve ability  to use RUE as assist during daily tasks.    Time 6   Period Weeks   Status Achieved     OT SHORT TERM GOAL #4   Title Pt will improve P/ROM of RUE to WNL to improve ability to donn shirts.    Time 6   Period Weeks   Status Partially Met     OT SHORT TERM GOAL #5   Title Pt will improve RUE strength to 3+/5 to improve ability to reach items at shoulder height.    Time 6   Period Weeks   Status Achieved           OT Long Term Goals - 01/20/17 1048      OT LONG TERM GOAL #1   Title Pt will return to highest level of functioning and independence in ADL and work tasks using RUE as assist.    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    Time 12   Period Weeks   Status On-going     OT LONG TERM GOAL #3   Title Pt will decrease RUE pain to 2/10 or less to improve ability to sleep in comfortable position at night.    Time 12   Period Weeks   Status On-going     OT LONG TERM GOAL #4   Title Pt will increase RUE A/ROM to [facility] to improve ability to perform overhead reaching tasks.    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    Time 12   Period Weeks   Status On-going               Plan - 01/20/17 1048    Clinical Impression Statement A: Reassessment completed this session, Pt has met 3/5 STGs and partially met an additional STG and is making progress towards remaining goals. Pt is making steady improvements in ROM, strength, and functional use of RUE. Progressed to A/ROM this session, added shoulder stretches to HEP. Pt will benefit from continues OT services 1x/week for 4 additional weeks to work on ROM WNL and strength for RUE use during functional tasls.    Plan P: Follow up on MD appt, follow up on shoulder stretching HEP. Add scapular theraband if pt able to tolerate   OT Home Exercise Plan 4/13: table slides, pendulums; 4/20: scapular A/ROM; 4/27: shoulder isometrics; 6/8: shoulder stretches   Consulted and Agree with Plan of Care Patient      Patient will benefit from skilled therapeutic intervention in order to improve the following deficits and impairments:  Impaired flexibility, Decreased strength, Decreased activity tolerance, Pain, Decreased range of motion, Increased fascial restricitons, Impaired UE functional use  Visit Diagnosis: Acute pain of right shoulder  Stiffness of right shoulder, not elsewhere classified  Other symptoms and signs involving the musculoskeletal system    Problem List Patient Active Problem List   Diagnosis Date Noted  . Complete tear of right rotator cuff   . Cubital tunnel syndrome on right    Guadelupe Sabin, OTR/L  [phone] 01/20/2017, 10:53 AM  [place]  [address] [place], [place], [address] Phone: [phone]   Fax:  [phone]  Name: AMANDA POTE MRN: [id number] Date of Birth: [birth year]

## 2017-01-24 ENCOUNTER — Encounter: Payer: Self-pay | Admitting: Orthopedic Surgery

## 2017-01-24 ENCOUNTER — Ambulatory Visit (INDEPENDENT_AMBULATORY_CARE_PROVIDER_SITE_OTHER): Payer: 59 | Admitting: Orthopedic Surgery

## 2017-01-24 DIAGNOSIS — Z9889 Other specified postprocedural states: Secondary | ICD-10-CM

## 2017-01-24 DIAGNOSIS — Z4889 Encounter for other specified surgical aftercare: Secondary | ICD-10-CM

## 2017-01-24 NOTE — Progress Notes (Signed)
Postop follow-up rotator cuff repair right shoulder  Chief Complaint  Patient presents with  . Follow-up    Recheck right shoulder, DOS 11-03-16.    Jeff[place] progressing very well. He's regained full forward elevation 45 of external rotation is a normal drop test now he does have some weakness in abduction  Recommend continue therapy follow-up 10 weeks

## 2017-01-27 ENCOUNTER — Encounter (HOSPITAL_COMMUNITY): Payer: Self-pay

## 2017-01-27 ENCOUNTER — Ambulatory Visit (HOSPITAL_COMMUNITY): Payer: 59

## 2017-01-27 DIAGNOSIS — M25611 Stiffness of right shoulder, not elsewhere classified: Secondary | ICD-10-CM

## 2017-01-27 DIAGNOSIS — R29898 Other symptoms and signs involving the musculoskeletal system: Secondary | ICD-10-CM

## 2017-01-27 DIAGNOSIS — M25511 Pain in right shoulder: Secondary | ICD-10-CM

## 2017-01-27 NOTE — Therapy (Signed)
[place], [place], [address] Phone: [phone]   Fax:  [phone]  Occupational Therapy Treatment  Patient Details  Name: Jesus Ingram MRN: [id number] Date of Birth: Mar 04, 1961 Referring Provider: Dr. Arther Abbott  Encounter Date: 01/27/2017      OT End of Session - 01/27/17 1028    Visit Number 10   Number of Visits 12   Date for OT Re-Evaluation 01/24/17  02/17/2017   Authorization Type [employer]   Authorization Time Period $30 copay; at 40th visit medical record review required   Authorization - Visit Number 10   Authorization - Number of Visits 40   OT Start Time [phone]   OT Stop Time 1029   OT Time Calculation (min) 43 min   Activity Tolerance Patient tolerated treatment well   Behavior During Therapy [facility] for tasks assessed/performed      Past Medical History:  Diagnosis Date  . Arthritis   . Borderline diabetes   . Chronic back pain   . Diabetes mellitus without complication ([place])   . GERD (gastroesophageal reflux disease)   . History of kidney stones    history of kidney stones.  . History of renal calculi   . Reflux    occasional, uses TUMS  . Restless leg syndrome   . Ruptured disc, thoracic   . Seasonal allergies   . Sleep apnea    lost weight and has gotten better    Past Surgical History:  Procedure Laterality Date  . CATARACT EXTRACTION W/PHACO  01/10/2012   Procedure: CATARACT EXTRACTION PHACO AND INTRAOCULAR LENS PLACEMENT (IOC);  Surgeon: Elta Guadeloupe T. Gershon Crane, MD;  Location: [facility];  Service: Ophthalmology;  Laterality: Right;  CDE 9.77  . CATARACT EXTRACTION W/PHACO Left 01/13/2015   Procedure: CATARACT EXTRACTION PHACO AND INTRAOCULAR LENS PLACEMENT (IOC);  Surgeon: Rutherford Guys, MD;  Location: [facility];  Service: Ophthalmology;  Laterality: Left;  CDE:3.08  . COLONOSCOPY N/A 04/25/2013   Procedure: COLONOSCOPY;  Surgeon: Rogene Houston, MD;  [facility]: [facility];   Service: Endoscopy;  Laterality: N/A;  830  . CYSTOSCOPY/RETROGRADE/URETEROSCOPY/STONE EXTRACTION WITH BASKET  2011   with stents, [place]; [place]  . ELBOW ARTHROSCOPY W/ SYNOVECTOMY    . LITHOTRIPSY    . SHOULDER ARTHROSCOPY WITH ROTATOR CUFF REPAIR Right 11/03/2016   Procedure: SHOULDER ARTHROSCOPY WITH ROTATOR CUFF REPAIR;  Surgeon: Carole Civil, MD;  Location: [facility];  Service: Orthopedics;  Laterality: Right;  . ULNAR TUNNEL RELEASE Right 07/14/2016   Procedure: CUBITAL TUNNEL RELEASE;  Surgeon: Carole Civil, MD;  Location: [facility];  Service: Orthopedics;  Laterality: Right;  . WISDOM TOOTH EXTRACTION      There were no vitals filed for this visit.      Subjective Assessment - 01/27/17 1000    Subjective  S: I haven't really been doing my exercises because I haven't had time.   Currently in Pain? No/denies            [facility] OT Assessment - 01/27/17 1001      Assessment   Diagnosis R RCR     Precautions   Precautions Shoulder   Type of Shoulder Precautions P/ROM 4/13-5/11. AA/ROM and progress as tolerated.     Shoulder Interventions Shoulder sling/immobilizer;At all times                  OT Treatments/Exercises (OP) - 01/27/17 1002      Exercises   Exercises Shoulder  Shoulder Exercises: Supine   Protraction PROM;5 reps;AROM;10 reps   Horizontal ABduction PROM;5 reps;AROM;10 reps   External Rotation PROM;5 reps;AROM;10 reps   Internal Rotation PROM;5 reps;AROM;10 reps   Flexion PROM;5 reps;AROM;10 reps   ABduction PROM;5 reps;AROM;10 reps     Shoulder Exercises: Standing   Protraction AROM;10 reps   Horizontal ABduction AROM;10 reps   External Rotation AROM;10 reps   Internal Rotation AROM;10 reps   Flexion AROM;10 reps   ABduction AROM;10 reps   Extension Theraband;10 reps   Theraband Level (Shoulder Extension) Level 2 (Red)   Row Theraband;10 reps   Theraband Level (Shoulder Row) Level 2 (Red)   Retraction Theraband;10 reps    Theraband Level (Shoulder Retraction) Level 2 (Red)     Shoulder Exercises: ROM/Strengthening   "W" Arms 10X   X to V Arms 10X   Proximal Shoulder Strengthening, Supine 12X each no rest breaks   Proximal Shoulder Strengthening, Seated 12X each no rest breaks     Shoulder Exercises: Stretch   Corner Stretch 3 reps;10 seconds   Cross Chest Stretch 3 reps;10 seconds   Internal Rotation Stretch 3 reps  10 seconds   External Rotation Stretch 3 reps;10 seconds   Wall Stretch - Flexion 3 reps;10 seconds                OT Education - 01/27/17 1114    Education provided No          OT Short Term Goals - 01/27/17 1027      OT SHORT TERM GOAL #1   Title Pt will be educated on HEP for improved mobility of RUE during daily tasks.    Time 6   Period Weeks   Status On-going     OT SHORT TERM GOAL #2   Title Pt will decrease RUE fascial restrictions from mod to minimal amounts to improve mobility needed for overhead reaching.    Time 6   Period Weeks     OT SHORT TERM GOAL #3   Title Pt will decrease pain in RUE to 4/10 to improve ability to use RUE as assist during daily tasks.    Time 6   Period Weeks     OT SHORT TERM GOAL #4   Title Pt will improve P/ROM of RUE to WNL to improve ability to donn shirts.    Time 6   Period Weeks   Status Partially Met     OT SHORT TERM GOAL #5   Title Pt will improve RUE strength to 3+/5 to improve ability to reach items at shoulder height.    Time 6   Period Weeks           OT Long Term Goals - 01/27/17 1027      OT LONG TERM GOAL #1   Title Pt will return to highest level of functioning and independence in ADL and work tasks using RUE as assist.    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    Time 12   Period Weeks   Status On-going     OT LONG TERM GOAL  #3   Title Pt will decrease RUE pain to 2/10 or less to improve ability to sleep in comfortable position at night.    Time 12  Period Weeks   Status On-going     OT LONG TERM GOAL #4   Title Pt will increase RUE A/ROM to [facility] to improve ability to perform overhead reaching tasks.    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    Time 12   Period Weeks   Status On-going               Plan - 01/27/17 1111    Clinical Impression Statement A: Pt presented with decreased fascial restrictions in RUE. Completed A/ROM exercises with VC needed for form and technique. Pt increased stretching repetitions and added scapular strengthening exercises with theraband. Pt reports that at his doctors visit the doctor said he was ready to begin light strengthening exercises.    Plan P: Follow up on scapular strengthening HEP. Progress to shoulder strengthening with theraband/1# weight when able to.      Patient will benefit from skilled therapeutic intervention in order to improve the following deficits and impairments:  Impaired flexibility, Decreased strength, Decreased activity tolerance, Pain, Decreased range of motion, Increased fascial restricitons, Impaired UE functional use  Visit Diagnosis: Acute pain of right shoulder  Stiffness of right shoulder, not elsewhere classified  Other symptoms and signs involving the musculoskeletal system    Problem List Patient Active Problem List   Diagnosis Date Noted  . Complete tear of right rotator cuff   . Cubital tunnel syndrome on right     Luther Hearing, OT Student [phone] 01/27/2017, 11:34 AM  [place], [place], [address] Phone: [phone]   Fax:  [phone]  Name: JUANDIEGO KOLENOVIC MRN: [id number] Date of Birth: 04/24/1961     Note reviewed by clinical instructor and accurately reflects treatment session.   Ailene Ravel, OTR/L,CBIS  [phone]

## 2017-01-27 NOTE — Patient Instructions (Signed)

## 2017-02-03 ENCOUNTER — Encounter (HOSPITAL_COMMUNITY): Payer: Self-pay | Admitting: Occupational Therapy

## 2017-02-03 ENCOUNTER — Ambulatory Visit (HOSPITAL_COMMUNITY): Payer: 59 | Admitting: Occupational Therapy

## 2017-02-03 DIAGNOSIS — R29898 Other symptoms and signs involving the musculoskeletal system: Secondary | ICD-10-CM

## 2017-02-03 DIAGNOSIS — M25611 Stiffness of right shoulder, not elsewhere classified: Secondary | ICD-10-CM

## 2017-02-03 DIAGNOSIS — M25511 Pain in right shoulder: Secondary | ICD-10-CM | POA: Diagnosis not present

## 2017-02-03 NOTE — Therapy (Signed)
[place], [place], [address] Phone: [phone]   Fax:  [phone]  Occupational Therapy Treatment  Patient Details  Name: Jesus Ingram MRN: [id number] Date of Birth: 1961/07/08 Referring Provider: Dr. Arther Abbott  Encounter Date: 02/03/2017      OT End of Session - 02/03/17 1056    Visit Number 11   Number of Visits 12   Date for OT Re-Evaluation 02/17/17   Authorization Type [employer]   Authorization Time Period $30 copay; at 40th visit medical record review required   Authorization - Visit Number 11   Authorization - Number of Visits 40   OT Start Time 0946   OT Stop Time 1031   OT Time Calculation (min) 45 min   Activity Tolerance Patient tolerated treatment well   Behavior During Therapy [facility] for tasks assessed/performed      Past Medical History:  Diagnosis Date  . Arthritis   . Borderline diabetes   . Chronic back pain   . Diabetes mellitus without complication ([place])   . GERD (gastroesophageal reflux disease)   . History of kidney stones    history of kidney stones.  . History of renal calculi   . Reflux    occasional, uses TUMS  . Restless leg syndrome   . Ruptured disc, thoracic   . Seasonal allergies   . Sleep apnea    lost weight and has gotten better    Past Surgical History:  Procedure Laterality Date  . CATARACT EXTRACTION W/PHACO  01/10/2012   Procedure: CATARACT EXTRACTION PHACO AND INTRAOCULAR LENS PLACEMENT (IOC);  Surgeon: Elta Guadeloupe T. Gershon Crane, MD;  Location: [facility];  Service: Ophthalmology;  Laterality: Right;  CDE 9.77  . CATARACT EXTRACTION W/PHACO Left 01/13/2015   Procedure: CATARACT EXTRACTION PHACO AND INTRAOCULAR LENS PLACEMENT (IOC);  Surgeon: Rutherford Guys, MD;  Location: [facility];  Service: Ophthalmology;  Laterality: Left;  CDE:3.08  . COLONOSCOPY N/A 04/25/2013   Procedure: COLONOSCOPY;  Surgeon: Rogene Houston, MD;  Location: [facility];  Service:  Endoscopy;  Laterality: N/A;  830  . CYSTOSCOPY/RETROGRADE/URETEROSCOPY/STONE EXTRACTION WITH BASKET  2011   with stents, [place]; [place]  . ELBOW ARTHROSCOPY W/ SYNOVECTOMY    . LITHOTRIPSY    . SHOULDER ARTHROSCOPY WITH ROTATOR CUFF REPAIR Right 11/03/2016   Procedure: SHOULDER ARTHROSCOPY WITH ROTATOR CUFF REPAIR;  Surgeon: Carole Civil, MD;  Location: [facility];  Service: Orthopedics;  Laterality: Right;  . ULNAR TUNNEL RELEASE Right 07/14/2016   Procedure: CUBITAL TUNNEL RELEASE;  Surgeon: Carole Civil, MD;  Location: [facility];  Service: Orthopedics;  Laterality: Right;  . WISDOM TOOTH EXTRACTION      There were no vitals filed for this visit.      Subjective Assessment - 02/03/17 0946    Subjective  S: That towel thing is really hard.    Currently in Pain? Yes   Pain Score 2    Pain Location Shoulder   Pain Orientation Right   Pain Descriptors / Indicators Aching;Sore   Pain Type Acute pain   Pain Radiating Towards none   Pain Onset More than a month ago   Pain Frequency Intermittent   Aggravating Factors  movement   Pain Relieving Factors heat, aleve   Effect of Pain on Daily Activities mod effect on RUE use for daily tasks   Multiple Pain Sites No            [facility] OT Assessment - 02/03/17  0945      Assessment   Diagnosis R RCR     Precautions   Precautions Shoulder   Type of Shoulder Precautions P/ROM 4/13-5/11. AA/ROM and progress as tolerated.                    OT Treatments/Exercises (OP) - 02/03/17 0948      Exercises   Exercises Shoulder     Shoulder Exercises: Supine   Protraction PROM;5 reps;Strengthening;12 reps   Protraction Weight (lbs) 1   Horizontal ABduction PROM;5 reps;Strengthening;12 reps   Horizontal ABduction Weight (lbs) 1   External Rotation PROM;5 reps;Strengthening;12 reps   External Rotation Weight (lbs) 1   Internal Rotation PROM;5 reps;Strengthening;12 reps   Internal Rotation Weight (lbs) 1   Flexion PROM;5  reps;Strengthening;12 reps   Shoulder Flexion Weight (lbs) 1   ABduction PROM;5 reps;Strengthening;12 reps   Shoulder ABduction Weight (lbs) 1     Shoulder Exercises: Standing   Protraction AROM;15 reps   Horizontal ABduction AROM;15 reps   External Rotation AROM;15 reps   Internal Rotation AROM;15 reps   Flexion AROM;15 reps   ABduction AROM;15 reps   Extension Theraband;10 reps   Theraband Level (Shoulder Extension) Level 2 (Red)   Row Theraband;10 reps   Theraband Level (Shoulder Row) Level 2 (Red)   Retraction Theraband;10 reps   Theraband Level (Shoulder Retraction) Level 2 (Red)     Shoulder Exercises: ROM/Strengthening   X to V Arms 12X   Proximal Shoulder Strengthening, Supine 12X each 1#, no rest breaks     Shoulder Exercises: Stretch   Corner Stretch 2 reps;20 seconds   Internal Rotation Stretch 2 reps  20 seconds; pulling horizontally across back   External Rotation Stretch 2 reps;20 seconds   Wall Stretch - Flexion 2 reps;20 seconds     Manual Therapy   Manual Therapy Myofascial release;Muscle Energy Technique   Manual therapy comments completed separately from therapeutic exercises   Myofascial Release myofascial release to right upper arm, trapezius, and scapularis regions to decrease pain and fascial restrictions and increase joint mobility   Muscle Energy Technique Muscle energy technique to decrease muscle spasm and increase range of motion                  OT Short Term Goals - 01/27/17 1027      OT SHORT TERM GOAL #1   Title Pt will be educated on HEP for improved mobility of RUE during daily tasks.    Time 6   Period Weeks   Status On-going     OT SHORT TERM GOAL #2   Title Pt will decrease RUE fascial restrictions from mod to minimal amounts to improve mobility needed for overhead reaching.    Time 6   Period Weeks     OT SHORT TERM GOAL #3   Title Pt will decrease pain in RUE to 4/10 to improve ability to use RUE as assist during  daily tasks.    Time 6   Period Weeks     OT SHORT TERM GOAL #4   Title Pt will improve P/ROM of RUE to WNL to improve ability to donn shirts.    Time 6   Period Weeks   Status Partially Met     OT SHORT TERM GOAL #5   Title Pt will improve RUE strength to 3+/5 to improve ability to reach items at shoulder height.    Time 6   Period Weeks  OT Long Term Goals - 01/27/17 1027      OT LONG TERM GOAL #1   Title Pt will return to highest level of functioning and independence in ADL and work tasks using RUE as assist.    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    Time 12   Period Weeks   Status On-going     OT LONG TERM GOAL #3   Title Pt will decrease RUE pain to 2/10 or less to improve ability to sleep in comfortable position at night.    Time 12   Period Weeks   Status On-going     OT LONG TERM GOAL #4   Title Pt will increase RUE A/ROM to [facility] to improve ability to perform overhead reaching tasks.    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    Time 12   Period Weeks   Status On-going               Plan - 02/03/17 1056    Clinical Impression Statement A: Pt reports MD is pleased with progress and has instructed pt to begin light strengthening. Added 1# weight in supine, increased A/ROM in standing to 15 reps, continued with scapular theraband. Pt required verbal cuing for form and technqiue, instructed pt to complete IR rotation stretch horizontally across back this week.    Plan P: Add 1# weight in standing if pt able to tolerate, add UBE   OT Home Exercise Plan 4/13: table slides, pendulums; 4/20: scapular A/ROM; 4/27: shoulder isometrics; 6/8: shoulder  stretches   Consulted and Agree with Plan of Care Patient      Patient will benefit from skilled therapeutic intervention in order to improve the following deficits and impairments:  Impaired flexibility, Decreased strength, Decreased activity tolerance, Pain, Decreased range of motion, Increased fascial restricitons, Impaired UE functional use  Visit Diagnosis: Acute pain of right shoulder  Stiffness of right shoulder, not elsewhere classified  Other symptoms and signs involving the musculoskeletal system    Problem List Patient Active Problem List   Diagnosis Date Noted  . Complete tear of right rotator cuff   . Cubital tunnel syndrome on right    Guadelupe Sabin, OTR/L  [phone] 02/03/2017, 11:00 AM  [place], [place], [address] Phone: [phone]   Fax:  [phone]  Name: Jesus Ingram MRN: [id number] Date of Birth: 1960-09-02

## 2017-02-06 DIAGNOSIS — G4733 Obstructive sleep apnea (adult) (pediatric): Secondary | ICD-10-CM | POA: Diagnosis not present

## 2017-02-10 ENCOUNTER — Ambulatory Visit (HOSPITAL_COMMUNITY): Payer: 59 | Admitting: Occupational Therapy

## 2017-02-10 ENCOUNTER — Telehealth (HOSPITAL_COMMUNITY): Payer: Self-pay | Admitting: Pulmonary Disease

## 2017-02-10 NOTE — Telephone Encounter (Signed)
02/10/17  pt came in and said he wanted to just wait to have therapy on next scheduled day, had alot to do

## 2017-02-14 ENCOUNTER — Encounter (HOSPITAL_COMMUNITY): Payer: Self-pay

## 2017-02-14 ENCOUNTER — Ambulatory Visit (HOSPITAL_COMMUNITY): Payer: 59 | Attending: Orthopedic Surgery

## 2017-02-14 DIAGNOSIS — M25611 Stiffness of right shoulder, not elsewhere classified: Secondary | ICD-10-CM | POA: Insufficient documentation

## 2017-02-14 DIAGNOSIS — M25511 Pain in right shoulder: Secondary | ICD-10-CM

## 2017-02-14 DIAGNOSIS — R29898 Other symptoms and signs involving the musculoskeletal system: Secondary | ICD-10-CM | POA: Diagnosis present

## 2017-02-14 NOTE — Therapy (Signed)
[facility] [address] [place], [place], [address] Phone: [phone]   Fax:  [phone]  Occupational Therapy Treatment  Patient Details  Name: Jesus Ingram MRN: [id number] Date of Birth: 1961-01-31 Referring Provider: Dr. Arther Abbott  Encounter Date: 02/14/2017      OT End of Session - 02/14/17 1645    Visit Number 12   Number of Visits 12   Date for OT Re-Evaluation --   Authorization Type [employer]   Authorization Time Period $30 copay; at 40th visit medical record review required   Authorization - Visit Number 12   Authorization - Number of Visits 40   OT Start Time 6629   OT Stop Time 1701   OT Time Calculation (min) 57 min   Activity Tolerance Patient tolerated treatment well   Behavior During Therapy [facility] for tasks assessed/performed      Past Medical History:  Diagnosis Date  . Arthritis   . Borderline diabetes   . Chronic back pain   . Diabetes mellitus without complication ([place])   . GERD (gastroesophageal reflux disease)   . History of kidney stones    history of kidney stones.  . History of renal calculi   . Reflux    occasional, uses TUMS  . Restless leg syndrome   . Ruptured disc, thoracic   . Seasonal allergies   . Sleep apnea    lost weight and has gotten better    Past Surgical History:  Procedure Laterality Date  . CATARACT EXTRACTION W/PHACO  01/10/2012   Procedure: CATARACT EXTRACTION PHACO AND INTRAOCULAR LENS PLACEMENT (IOC);  Surgeon: Elta Guadeloupe T. Gershon Crane, MD;  Location: [facility];  Service: Ophthalmology;  Laterality: Right;  CDE 9.77  . CATARACT EXTRACTION W/PHACO Left 01/13/2015   Procedure: CATARACT EXTRACTION PHACO AND INTRAOCULAR LENS PLACEMENT (IOC);  Surgeon: Rutherford Guys, MD;  Location: [facility];  Service: Ophthalmology;  Laterality: Left;  CDE:3.08  . COLONOSCOPY N/A 04/25/2013   Procedure: COLONOSCOPY;  Surgeon: Rogene Houston, MD;  [facility];  Service: Endoscopy;   Laterality: N/A;  830  . CYSTOSCOPY/RETROGRADE/URETEROSCOPY/STONE EXTRACTION WITH BASKET  2011   with stents, [place]; [place]  . ELBOW ARTHROSCOPY W/ SYNOVECTOMY    . LITHOTRIPSY    . SHOULDER ARTHROSCOPY WITH ROTATOR CUFF REPAIR Right 11/03/2016   Procedure: SHOULDER ARTHROSCOPY WITH ROTATOR CUFF REPAIR;  Surgeon: Carole Civil, MD;  Location: [facility];  Service: Orthopedics;  Laterality: Right;  . ULNAR TUNNEL RELEASE Right 07/14/2016   Procedure: CUBITAL TUNNEL RELEASE;  Surgeon: Carole Civil, MD;  Location: [facility];  Service: Orthopedics;  Laterality: Right;  . WISDOM TOOTH EXTRACTION      There were no vitals filed for this visit.      Subjective Assessment - 02/14/17 1626    Subjective  S: I'm still really having trouble with this motion (external rotation).   Special Tests FOTO score: 72/100 (28% impaired)   Currently in Pain? No/denies            [facility] OT Assessment - 02/14/17 1730      Assessment   Diagnosis R RCR     Precautions   Precautions Shoulder   Type of Shoulder Precautions P/ROM 4/13-5/11. AA/ROM and progress as tolerated.       AROM   Overall AROM Comments Assessed seated, er/IR adducted   Right Shoulder Flexion 169 Degrees  previous 135   Right Shoulder ABduction 175 Degrees  previous: 170  Right Shoulder Internal Rotation 90 Degrees  previous: 90   Right Shoulder External Rotation 62 Degrees  previous: 59     Strength   Overall Strength Comments Assessed seated, er/IR adducted   Right Shoulder Flexion 5/5  previous: 4-/5   Right Shoulder ABduction 5/5  previous: 3+/5   Right Shoulder Internal Rotation 5/5  previous: 4-/5   Right Shoulder External Rotation 4+/5  previous: 3+/5                  OT Treatments/Exercises (OP) - 02/14/17 1628      Exercises   Exercises Shoulder     Shoulder Exercises: Supine   Protraction PROM;5 reps;Strengthening;12 reps   Protraction Weight (lbs) 1   Horizontal ABduction PROM;5  reps;Strengthening;12 reps   Horizontal ABduction Weight (lbs) 1   External Rotation PROM;5 reps;Strengthening;12 reps   External Rotation Weight (lbs) 1   Internal Rotation PROM;5 reps;Strengthening;12 reps   Internal Rotation Weight (lbs) 1   Flexion PROM;5 reps;Strengthening;12 reps   Shoulder Flexion Weight (lbs) 1   ABduction PROM;5 reps;Strengthening;12 reps   Shoulder ABduction Weight (lbs) 1     Shoulder Exercises: Standing   Protraction Strengthening;10 reps   Protraction Weight (lbs) 1   Horizontal ABduction Strengthening;10 reps   Horizontal ABduction Weight (lbs) 1   External Rotation Strengthening;10 reps   External Rotation Weight (lbs) 1   Internal Rotation Strengthening;10 reps   Internal Rotation Weight (lbs) 1   Flexion Strengthening;10 reps   Shoulder Flexion Weight (lbs) 1   ABduction Strengthening;10 reps   Shoulder ABduction Weight (lbs) 1   Extension Theraband;10 reps   Theraband Level (Shoulder Extension) Level 2 (Red)   Row Theraband;10 reps   Theraband Level (Shoulder Row) Level 2 (Red)   Retraction Theraband;10 reps   Theraband Level (Shoulder Retraction) Level 2 (Red)     Shoulder Exercises: ROM/Strengthening   UBE (Upper Arm Bike) level 1, 2' forwards, 2' backwards   X to V Arms 12X with 1#   Proximal Shoulder Strengthening, Seated 12X each no rest breaks     Manual Therapy   Manual Therapy Myofascial release;Muscle Energy Technique   Manual therapy comments completed separately from therapeutic exercises   Myofascial Release myofascial release to right upper arm, trapezius, and scapularis regions to decrease pain and fascial restrictions and increase joint mobility   Muscle Energy Technique Muscle energy technique to decrease muscle spasm and increase range of motion                OT Education - 02/14/17 1725    Education provided Yes   Education Details Educated pt on continuation of HEP and discussed D/C for therapy    Person(s)  Educated Patient   Methods Explanation;Demonstration;Verbal cues;Handout   Comprehension Verbalized understanding;Returned demonstration          OT Short Term Goals - 02/14/17 1650      OT SHORT TERM GOAL #1   Title Pt will be educated on HEP for improved mobility of RUE during daily tasks.    Time 6   Period Weeks   Status Achieved     OT SHORT TERM GOAL #2   Title Pt will decrease RUE fascial restrictions from mod to minimal amounts to improve mobility needed for overhead reaching.    Time 6   Period Weeks   Status Achieved     OT SHORT TERM GOAL #3   Title Pt will decrease pain in RUE to 4/10 to improve  ability to use RUE as assist during daily tasks.    Time 6   Period Weeks   Status Achieved     OT SHORT TERM GOAL #4   Title Pt will improve P/ROM of RUE to WNL to improve ability to donn shirts.    Time 6   Period Weeks   Status Achieved     OT SHORT TERM GOAL #5   Title Pt will improve RUE strength to 3+/5 to improve ability to reach items at shoulder height.    Time 6   Period Weeks   Status Achieved           OT Long Term Goals - 02/14/17 1652      OT LONG TERM GOAL #1   Title Pt will return to highest level of functioning and independence in ADL and work tasks using RUE as assist.    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    Time 12   Period Weeks   Status Achieved     OT LONG TERM GOAL #3   Title Pt will decrease RUE pain to 2/10 or less to improve ability to sleep in comfortable position at night.    Time 12   Period Weeks   Status Achieved     OT LONG TERM GOAL #4   Title Pt will increase RUE A/ROM to [facility] to improve ability to perform overhead reaching tasks.    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    Time 12   Period Weeks   Status Achieved               Plan - 02/14/17 1728    Clinical Impression Statement A: Pt reassessed during today's session and achieved all short term and long term goals. Pt improved RUE strength and A/ROM. Pt reports mild discomfort with external rotation when shoulder is adducted and abducted. When external rotation strength was tested it was 4+/5. Completed P/ROM and shoulder strengthening. Pt presented with good form during exercises. D/C after session.   Plan P: D/C with HEP      Patient will benefit from skilled therapeutic intervention in order to improve the following deficits and impairments:  Impaired flexibility, Decreased strength, Decreased activity tolerance, Pain, Decreased range of motion, Increased fascial restricitons, Impaired UE functional use  Visit Diagnosis: Acute pain of right shoulder  Stiffness of right shoulder, not elsewhere classified  Other symptoms and signs involving the musculoskeletal system    Problem List Patient Active Problem List   Diagnosis Date Noted  . Complete tear of right rotator cuff   . Cubital tunnel syndrome on right     Luther Hearing, OT Student [phone] 02/14/2017, 5:48 PM  [place], [place], [address] Phone: [phone]   Fax:  [phone]  Name: SHIGERU LAMPERT MRN: [id number] Date of Birth: 1961-04-05   OCCUPATIONAL THERAPY DISCHARGE SUMMARY  Visits from Start of Care: 12  Current functional level related to goals / functional outcomes: See above   Remaining deficits: See above  Education / Equipment: See above Plan: Patient agrees to discharge.  Patient goals were met. Patient is being discharged due to meeting the stated rehab goals.  ?????         Note reviewed by clinical instructor and accurately reflects treatment  session.    Ailene Ravel, OTR/L,CBIS  [phone]

## 2017-02-14 NOTE — Patient Instructions (Signed)
Shoulder External Rotation  Standing with your arm at 90 degrees by your side, using a towel to keep your arm at your side.  Grasp a theraband, pull your shoulder blades back, then proceed to rotate just the forearm out to the side and control the movement back to start.  Repeat.

## 2017-02-24 ENCOUNTER — Encounter (HOSPITAL_COMMUNITY): Payer: 59 | Admitting: Occupational Therapy

## 2017-03-01 DIAGNOSIS — M25511 Pain in right shoulder: Secondary | ICD-10-CM | POA: Diagnosis not present

## 2017-03-01 DIAGNOSIS — M1990 Unspecified osteoarthritis, unspecified site: Secondary | ICD-10-CM | POA: Diagnosis not present

## 2017-03-01 DIAGNOSIS — E119 Type 2 diabetes mellitus without complications: Secondary | ICD-10-CM | POA: Diagnosis not present

## 2017-03-01 DIAGNOSIS — G4733 Obstructive sleep apnea (adult) (pediatric): Secondary | ICD-10-CM | POA: Diagnosis not present

## 2017-03-01 LAB — HEMOGLOBIN A1C: Hemoglobin A1C: 6.9

## 2017-03-02 DIAGNOSIS — G4733 Obstructive sleep apnea (adult) (pediatric): Secondary | ICD-10-CM | POA: Diagnosis not present

## 2017-03-02 DIAGNOSIS — J301 Allergic rhinitis due to pollen: Secondary | ICD-10-CM | POA: Diagnosis not present

## 2017-03-02 DIAGNOSIS — E119 Type 2 diabetes mellitus without complications: Secondary | ICD-10-CM | POA: Diagnosis not present

## 2017-03-03 ENCOUNTER — Encounter (HOSPITAL_COMMUNITY): Payer: 59 | Admitting: Occupational Therapy

## 2017-03-08 DIAGNOSIS — G4733 Obstructive sleep apnea (adult) (pediatric): Secondary | ICD-10-CM | POA: Diagnosis not present

## 2017-03-10 ENCOUNTER — Encounter (HOSPITAL_COMMUNITY): Payer: 59

## 2017-03-15 DIAGNOSIS — R350 Frequency of micturition: Secondary | ICD-10-CM | POA: Diagnosis not present

## 2017-03-15 DIAGNOSIS — N390 Urinary tract infection, site not specified: Secondary | ICD-10-CM | POA: Diagnosis not present

## 2017-04-08 DIAGNOSIS — G4733 Obstructive sleep apnea (adult) (pediatric): Secondary | ICD-10-CM | POA: Diagnosis not present

## 2017-04-10 ENCOUNTER — Encounter: Payer: Self-pay | Admitting: Orthopedic Surgery

## 2017-04-10 ENCOUNTER — Ambulatory Visit (INDEPENDENT_AMBULATORY_CARE_PROVIDER_SITE_OTHER): Payer: 59 | Admitting: Orthopedic Surgery

## 2017-04-10 DIAGNOSIS — Z4889 Encounter for other specified surgical aftercare: Secondary | ICD-10-CM

## 2017-04-10 DIAGNOSIS — Z9889 Other specified postprocedural states: Secondary | ICD-10-CM

## 2017-04-10 NOTE — Progress Notes (Signed)
Post op visit   Encounter Diagnoses  Name Primary?  Marland Kitchen Aftercare following surgery Yes  . S/P right rotator cuff repair     Chief Complaint  Patient presents with  . Follow-up    Recheck on right shoulder, DOS 3--22-18.   5 months + post op   He's doing very well he has minimal discomfort. He can return to work September 4  He exhibits 180 of flexion excellent strength no real motion deficits  He can follow-up as needed with [place] return to work September 4

## 2017-04-10 NOTE — Patient Instructions (Signed)
rtw Sept 4 2018

## 2017-05-09 ENCOUNTER — Ambulatory Visit (HOSPITAL_COMMUNITY)
Admission: RE | Admit: 2017-05-09 | Discharge: 2017-05-09 | Disposition: A | Discharge status: Home / Self Care | Payer: 59 | Source: Ambulatory Visit | Attending: Urology | Admitting: Urology

## 2017-05-09 ENCOUNTER — Other Ambulatory Visit: Payer: Self-pay | Admitting: Urology

## 2017-05-09 ENCOUNTER — Ambulatory Visit (INDEPENDENT_AMBULATORY_CARE_PROVIDER_SITE_OTHER): Payer: 59 | Admitting: Urology

## 2017-05-09 DIAGNOSIS — N202 Calculus of kidney with calculus of ureter: Secondary | ICD-10-CM | POA: Diagnosis not present

## 2017-05-09 DIAGNOSIS — N200 Calculus of kidney: Secondary | ICD-10-CM | POA: Diagnosis not present

## 2017-05-09 DIAGNOSIS — G4733 Obstructive sleep apnea (adult) (pediatric): Secondary | ICD-10-CM | POA: Diagnosis not present

## 2017-06-05 DIAGNOSIS — E119 Type 2 diabetes mellitus without complications: Secondary | ICD-10-CM | POA: Diagnosis not present

## 2017-06-05 LAB — HEMOGLOBIN A1C: Hemoglobin A1C: 6.6

## 2017-06-08 DIAGNOSIS — G4733 Obstructive sleep apnea (adult) (pediatric): Secondary | ICD-10-CM | POA: Diagnosis not present

## 2017-07-02 ENCOUNTER — Encounter (HOSPITAL_COMMUNITY): Payer: Self-pay

## 2017-07-02 ENCOUNTER — Emergency Department (HOSPITAL_COMMUNITY)
Admission: EM | Admit: 2017-07-02 | Discharge: 2017-07-02 | Disposition: A | Discharge status: Home / Self Care | Payer: 59 | Attending: Emergency Medicine | Admitting: Emergency Medicine

## 2017-07-02 DIAGNOSIS — F17220 Nicotine dependence, chewing tobacco, uncomplicated: Secondary | ICD-10-CM | POA: Insufficient documentation

## 2017-07-02 DIAGNOSIS — N23 Unspecified renal colic: Secondary | ICD-10-CM | POA: Insufficient documentation

## 2017-07-02 DIAGNOSIS — R109 Unspecified abdominal pain: Secondary | ICD-10-CM | POA: Diagnosis present

## 2017-07-02 DIAGNOSIS — E119 Type 2 diabetes mellitus without complications: Secondary | ICD-10-CM | POA: Diagnosis not present

## 2017-07-02 DIAGNOSIS — Z79899 Other long term (current) drug therapy: Secondary | ICD-10-CM | POA: Diagnosis not present

## 2017-07-02 DIAGNOSIS — Z7984 Long term (current) use of oral hypoglycemic drugs: Secondary | ICD-10-CM | POA: Insufficient documentation

## 2017-07-02 LAB — URINALYSIS, ROUTINE W REFLEX MICROSCOPIC
Bacteria, UA: NONE SEEN
Bilirubin Urine: NEGATIVE
GLUCOSE, UA: NEGATIVE mg/dL
KETONES UR: NEGATIVE mg/dL
Leukocytes, UA: NEGATIVE
Nitrite: NEGATIVE
PH: 5 (ref 5.0–8.0)
Protein, ur: 30 mg/dL — AB
Specific Gravity, Urine: 1.024 (ref 1.005–1.030)

## 2017-07-02 LAB — CBC WITH DIFFERENTIAL/PLATELET
Basophils Absolute: 0 10*3/uL (ref 0.0–0.1)
Basophils Relative: 1 %
EOS ABS: 0.4 10*3/uL (ref 0.0–0.7)
EOS PCT: 5 %
HCT: 43.1 % (ref 39.0–52.0)
Hemoglobin: 14.5 g/dL (ref 13.0–17.0)
LYMPHS ABS: 1.8 10*3/uL (ref 0.7–4.0)
Lymphocytes Relative: 28 %
MCH: 29.4 pg (ref 26.0–34.0)
MCHC: 33.6 g/dL (ref 30.0–36.0)
MCV: 87.4 fL (ref 78.0–100.0)
MONOS PCT: 9 %
Monocytes Absolute: 0.6 10*3/uL (ref 0.1–1.0)
Neutro Abs: 3.8 10*3/uL (ref 1.7–7.7)
Neutrophils Relative %: 57 %
PLATELETS: 205 10*3/uL (ref 150–400)
RBC: 4.93 MIL/uL (ref 4.22–5.81)
RDW: 13.1 % (ref 11.5–15.5)
WBC: 6.6 10*3/uL (ref 4.0–10.5)

## 2017-07-02 LAB — BASIC METABOLIC PANEL
Anion gap: 8 (ref 5–15)
BUN: 16 mg/dL (ref 6–20)
CALCIUM: 9.6 mg/dL (ref 8.9–10.3)
CO2: 27 mmol/L (ref 22–32)
CREATININE: 1.55 mg/dL — AB (ref 0.61–1.24)
Chloride: 103 mmol/L (ref 101–111)
GFR calc Af Amer: 57 mL/min — ABNORMAL LOW (ref >60)
GFR, EST NON AFRICAN AMERICAN: 49 mL/min — AB (ref >60)
Glucose, Bld: 121 mg/dL — ABNORMAL HIGH (ref 65–99)
Potassium: 4 mmol/L (ref 3.5–5.1)
SODIUM: 138 mmol/L (ref 135–145)

## 2017-07-02 MED ORDER — SODIUM CHLORIDE 0.9 % IV BOLUS (SEPSIS)
1000.0000 mL | Freq: Once | INTRAVENOUS | Status: AC
  Administered 2017-07-02: 1000 mL via INTRAVENOUS

## 2017-07-02 MED ORDER — HYDROMORPHONE HCL 2 MG PO TABS: 1.0000 mg | ORAL_TABLET | ORAL | 0 refills | Status: DC | PRN

## 2017-07-02 MED ORDER — ONDANSETRON 4 MG PO TBDP: 4.0000 mg | ORAL_TABLET | Freq: Three times a day (TID) | ORAL | 0 refills | Status: DC | PRN

## 2017-07-02 MED ORDER — ONDANSETRON HCL 4 MG/2ML IJ SOLN
4.0000 mg | Freq: Once | INTRAMUSCULAR | Status: AC
  Administered 2017-07-02: 4 mg via INTRAVENOUS
  Filled 2017-07-02: qty 2

## 2017-07-02 MED ORDER — KETOROLAC TROMETHAMINE 30 MG/ML IJ SOLN
30.0000 mg | Freq: Once | INTRAMUSCULAR | Status: AC
  Administered 2017-07-02: 30 mg via INTRAVENOUS
  Filled 2017-07-02: qty 1

## 2017-07-02 MED ORDER — TAMSULOSIN HCL 0.4 MG PO CAPS: 0.4000 mg | ORAL_CAPSULE | Freq: Every day | ORAL | 0 refills | Status: DC

## 2017-07-02 MED ORDER — KETOROLAC TROMETHAMINE 10 MG PO TABS: 10.0000 mg | ORAL_TABLET | Freq: Four times a day (QID) | ORAL | 0 refills | Status: DC | PRN

## 2017-07-02 NOTE — ED Triage Notes (Signed)
Pt reports woke up at 6 am with r flank pain.  Reports history of kidney stones.

## 2017-07-02 NOTE — ED Provider Notes (Signed)
[facility] EMERGENCY DEPARTMENT Provider Note   CSN: [id number] Arrival date & time: 07/02/17  4098     History   Chief Complaint Chief Complaint  Patient presents with  . Flank Pain    HPI Jesus Ingram is a 56 y.o. male.  Pt presents to the ED today with right sided flank pain c/w kidney stone.  Pt has had multiple stones in the past, last in January.  The pt has not had to have a stone removed for about 7 years.  He said his pain woke him from sleep around 0600.  He said the pain has calmed down a little now.  No vomiting, but he has nausea.      Past Medical History:  Diagnosis Date  . Arthritis    denies  . Borderline diabetes   . Chronic back pain   . Diabetes mellitus without complication (HCC)   . GERD (gastroesophageal reflux disease)   . History of kidney stones    history of kidney stones.  . History of renal calculi   . Reflux    occasional, uses TUMS  . Restless leg syndrome   . Ruptured disc, thoracic   . Seasonal allergies   . Sleep apnea    lost weight and has gotten better    Patient Active Problem List   Diagnosis Date Noted  . Complete tear of right rotator cuff   . Cubital tunnel syndrome on right     Past Surgical History:  Procedure Laterality Date  . CATARACT EXTRACTION PHACO AND INTRAOCULAR LENS PLACEMENT (IOC) Left 01/13/2015   Performed by Jethro Bolus, MD at [facility]  . CATARACT EXTRACTION PHACO AND INTRAOCULAR LENS PLACEMENT (IOC) Right 01/10/2012   Performed by Exie Parody., MD at [facility]  . COLONOSCOPY N/A 04/25/2013   Performed by Malissa Hippo, MD at [facility]  . CUBITAL TUNNEL RELEASE Right 07/14/2016   Performed by Vickki Hearing, MD at [facility]  . CYSTOSCOPY/RETROGRADE/URETEROSCOPY/STONE EXTRACTION WITH BASKET  2011   with stents, [facility]; [facility]  . ELBOW ARTHROSCOPY W/ SYNOVECTOMY    . LITHOTRIPSY    . SHOULDER ARTHROSCOPY WITH ROTATOR CUFF REPAIR Right 11/03/2016   Performed by Vickki Hearing, MD at  [facility]  . WISDOM TOOTH EXTRACTION         Home Medications    Prior to Admission medications   Medication Sig Start Date End Date Taking? Authorizing Provider  acetaminophen-codeine (TYLENOL #3) 300-30 MG tablet Take 1 tablet by mouth every 4 (four) hours as needed for moderate pain.  07/14/16   [provider]  calcium carbonate (TUMS - DOSED IN MG ELEMENTAL CALCIUM) 500 MG chewable tablet Chew 2 tablets by mouth daily as needed for indigestion or heartburn.     [provider]  HYDROcodone-acetaminophen (NORCO) 7.5-325 MG tablet Take 1 tablet by mouth every 4 (four) hours as needed for moderate pain. 11/03/16   Vickki Hearing, MD  HYDROmorphone (DILAUDID) 2 MG tablet Take 0.5 tablets (1 mg total) every 4 (four) hours as needed by mouth for severe pain. 07/02/17   Jacalyn Lefevre, MD  ibuprofen (ADVIL,MOTRIN) 800 MG tablet Take 1 tablet (800 mg total) by mouth every 8 (eight) hours as needed. 11/03/16   Vickki Hearing, MD  ketorolac (TORADOL) 10 MG tablet Take 1 tablet (10 mg total) every 6 (six) hours as needed by mouth. 07/02/17   Jacalyn Lefevre, MD  metFORMIN (GLUCOPHAGE) 500 MG tablet Take  500 mg by mouth every evening.  01/18/16   [provider]  methocarbamol (ROBAXIN) 500 MG tablet Take 1 tablet (500 mg total) by mouth 4 (four) times daily. 11/03/16   Vickki HearingHarrison, Stanley E, MD  nabumetone (RELAFEN) 750 MG tablet Take 750 mg by mouth daily as needed for moderate pain.     [provider]  ondansetron (ZOFRAN ODT) 4 MG disintegrating tablet Take 1 tablet (4 mg total) every 8 (eight) hours as needed by mouth. 07/02/17   Jacalyn LefevreHaviland, Presly Steinruck, MD  promethazine (PHENERGAN) 12.5 MG tablet Take 1 tablet (12.5 mg total) by mouth every 6 (six) hours as needed for nausea or vomiting. 11/03/16   Vickki HearingHarrison, Stanley E, MD  rOPINIRole (REQUIP) 0.5 MG tablet Take 0.5 mg by mouth at bedtime as needed (restless legs).     [provider]  tamsulosin (FLOMAX)  0.4 MG CAPS capsule Take 1 capsule (0.4 mg total) daily by mouth. 07/02/17   Jacalyn LefevreHaviland, Benny Deutschman, MD    Family History Family History  Problem Relation Age of Onset  . Colon cancer Neg Hx     Social History Social History   Tobacco Use  . Smoking status: Never Smoker  . Smokeless tobacco: Current User    Types: Chew  Substance Use Topics  . Alcohol use: No  . Drug use: No     Allergies   Morphine and related   Review of Systems Review of Systems  Gastrointestinal: Positive for nausea.  Genitourinary: Positive for flank pain.  All other systems reviewed and are negative.    Physical Exam Updated Vital Signs BP 126/88 (BP Location: Left Arm)   Pulse 69   Temp 98 F (36.7 C) (Oral)   Resp 20   Ht 5\' 9"  (1.753 m)   Wt 106.6 kg (235 lb)   SpO2 98%   BMI 34.70 kg/m   Physical Exam  Constitutional: He is oriented to person, place, and time. He appears well-developed and well-nourished.  HENT:  Head: Normocephalic and atraumatic.  Right Ear: External ear normal.  Left Ear: External ear normal.  Nose: Nose normal.  Mouth/Throat: Oropharynx is clear and moist.  Eyes: Conjunctivae and EOM are normal. Pupils are equal, round, and reactive to light.  Neck: Normal range of motion. Neck supple.  Cardiovascular: Normal rate, regular rhythm, normal heart sounds and intact distal pulses.  Pulmonary/Chest: Effort normal and breath sounds normal.  Abdominal: Soft. Bowel sounds are normal.  Musculoskeletal: Normal range of motion.  Neurological: He is alert and oriented to person, place, and time.  Skin: Skin is warm and dry. Capillary refill takes less than 2 seconds.  Psychiatric: He has a normal mood and affect. His behavior is normal. Judgment and thought content normal.  Nursing note and vitals reviewed.    ED Treatments / Results  Labs (all labs ordered are listed, but only abnormal results are displayed) Labs Reviewed  BASIC METABOLIC PANEL - Abnormal; Notable  for the following components:      Result Value   Glucose, Bld 121 (*)    Creatinine, Ser 1.55 (*)    GFR calc non Af Amer 49 (*)    GFR calc Af Amer 57 (*)    All other components within normal limits  URINALYSIS, ROUTINE W REFLEX MICROSCOPIC - Abnormal; Notable for the following components:   APPearance HAZY (*)    Hgb urine dipstick LARGE (*)    Protein, ur 30 (*)    Squamous Epithelial / LPF 0-5 (*)  All other components within normal limits  CBC WITH DIFFERENTIAL/PLATELET    EKG  EKG Interpretation None       Radiology No results found.  Procedures Procedures (including critical care time)  Medications Ordered in ED Medications  sodium chloride 0.9 % bolus 1,000 mL (1,000 mLs Intravenous New Bag/Given 07/02/17 [id number])  ketorolac (TORADOL) 30 MG/ML injection 30 mg (30 mg Intravenous Given 07/02/17 0812)  ondansetron (ZOFRAN) injection 4 mg (4 mg Intravenous Given 07/02/17 [id number])     Initial Impression / Assessment and Plan / ED Course  I have reviewed the triage vital signs and the nursing notes.  Pertinent labs & imaging results that were available during my care of the patient were reviewed by me and considered in my medical decision making (see chart for details).     Pt is feeling much better.  His pain is under control with sx c/w prior kidney stones.  No need for imaging now.   Pt requests hydromorphone instead of lortab.  He knows to return if worse and to f/u with urology.  Final Clinical Impressions(s) / ED Diagnoses   Final diagnoses:  Renal colic on right side    ED Discharge Orders        Ordered    ondansetron (ZOFRAN ODT) 4 MG disintegrating tablet  Every 8 hours PRN     07/02/17 0908    tamsulosin (FLOMAX) 0.4 MG CAPS capsule  Daily     07/02/17 0908    ketorolac (TORADOL) 10 MG tablet  Every 6 hours PRN     07/02/17 0908    HYDROmorphone (DILAUDID) 2 MG tablet  Every 4 hours PRN     07/02/17 0908       Jacalyn LefevreHaviland, Lachrista Heslin, MD 07/02/17  [phone]

## 2017-07-09 DIAGNOSIS — G4733 Obstructive sleep apnea (adult) (pediatric): Secondary | ICD-10-CM | POA: Diagnosis not present

## 2017-08-08 DIAGNOSIS — G4733 Obstructive sleep apnea (adult) (pediatric): Secondary | ICD-10-CM | POA: Diagnosis not present

## 2017-09-08 DIAGNOSIS — G4733 Obstructive sleep apnea (adult) (pediatric): Secondary | ICD-10-CM | POA: Diagnosis not present

## 2017-10-09 DIAGNOSIS — G4733 Obstructive sleep apnea (adult) (pediatric): Secondary | ICD-10-CM | POA: Diagnosis not present

## 2017-10-19 DIAGNOSIS — G4733 Obstructive sleep apnea (adult) (pediatric): Secondary | ICD-10-CM | POA: Diagnosis not present

## 2017-10-27 DIAGNOSIS — E119 Type 2 diabetes mellitus without complications: Secondary | ICD-10-CM | POA: Diagnosis not present

## 2017-10-27 DIAGNOSIS — J019 Acute sinusitis, unspecified: Secondary | ICD-10-CM | POA: Diagnosis not present

## 2017-10-27 DIAGNOSIS — K219 Gastro-esophageal reflux disease without esophagitis: Secondary | ICD-10-CM | POA: Diagnosis not present

## 2017-11-06 DIAGNOSIS — G4733 Obstructive sleep apnea (adult) (pediatric): Secondary | ICD-10-CM | POA: Diagnosis not present

## 2017-12-07 DIAGNOSIS — G4733 Obstructive sleep apnea (adult) (pediatric): Secondary | ICD-10-CM | POA: Diagnosis not present

## 2018-01-29 DIAGNOSIS — E119 Type 2 diabetes mellitus without complications: Secondary | ICD-10-CM | POA: Diagnosis not present

## 2018-01-29 LAB — HEMOGLOBIN A1C: Hemoglobin A1C: 6.3

## 2018-02-26 DIAGNOSIS — E119 Type 2 diabetes mellitus without complications: Secondary | ICD-10-CM | POA: Diagnosis not present

## 2018-02-26 DIAGNOSIS — M25512 Pain in left shoulder: Secondary | ICD-10-CM | POA: Diagnosis not present

## 2018-05-25 DIAGNOSIS — E119 Type 2 diabetes mellitus without complications: Secondary | ICD-10-CM | POA: Diagnosis not present

## 2018-05-25 LAB — HEMOGLOBIN A1C

## 2018-05-29 DIAGNOSIS — E669 Obesity, unspecified: Secondary | ICD-10-CM | POA: Diagnosis not present

## 2018-05-29 DIAGNOSIS — M25512 Pain in left shoulder: Secondary | ICD-10-CM | POA: Diagnosis not present

## 2018-05-29 DIAGNOSIS — E119 Type 2 diabetes mellitus without complications: Secondary | ICD-10-CM | POA: Diagnosis not present

## 2018-08-20 IMAGING — MR MR SHOULDER*R* W/O CM
4 of 5 series · 19 of 40 positions shown · non-contrast
Comparison: Radiographs dated 03/15/2016

CLINICAL DATA: Right shoulder pain for 4 months.

EXAM:
MRI OF THE RIGHT SHOULDER WITHOUT CONTRAST
TECHNIQUE: Multiplanar, multisequence MR imaging of the shoulder was performed.
No intravenous contrast was administered.

[Series 3: t2fs axial · axial · 3.0mm · 0.23mm/px · z∈[+6,+54]mm · 3 of 20 slices shown]
[im 3/20]
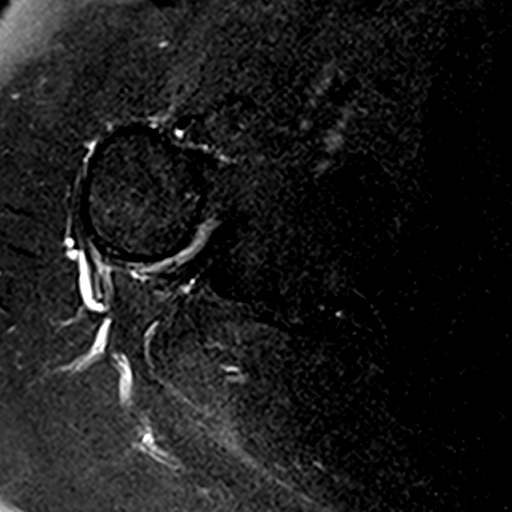
[im 11/20]
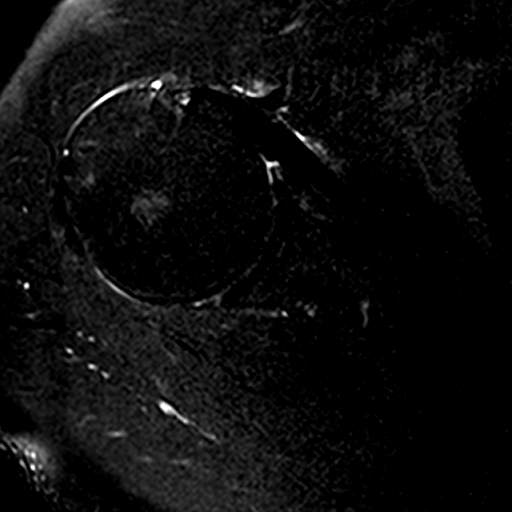
[im 17/20]
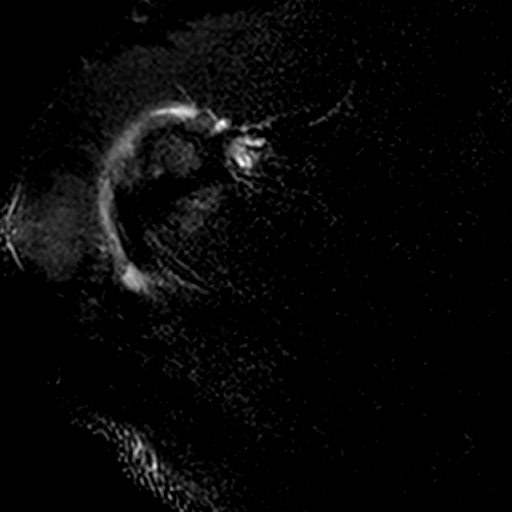

[Series 4: t2fs coronal · oblique · 3.0mm · 0.27mm/px · 3 of 20 slices shown]
[im 4/20]
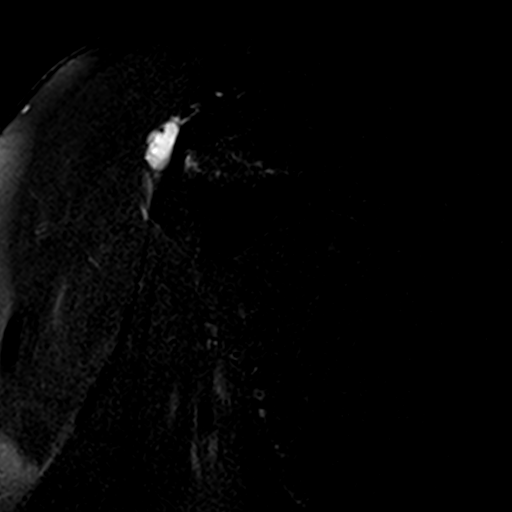
[im 10/20]
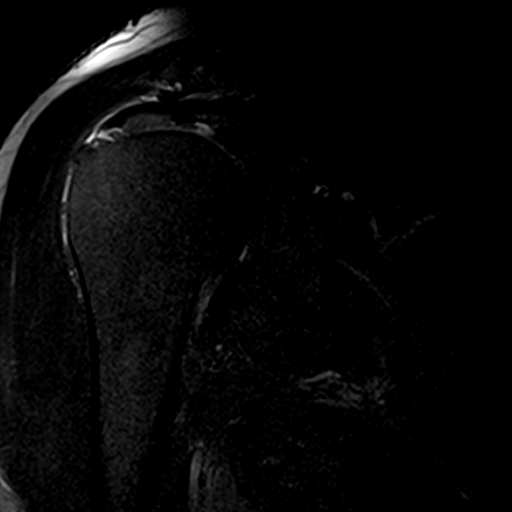
[im 16/20]
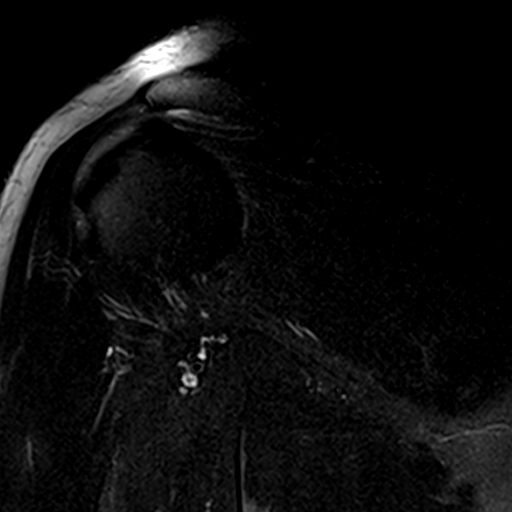

[Series 5: PD · oblique · 3.0mm · 0.27mm/px · 7 of 20 slices shown]
[im 1/20]
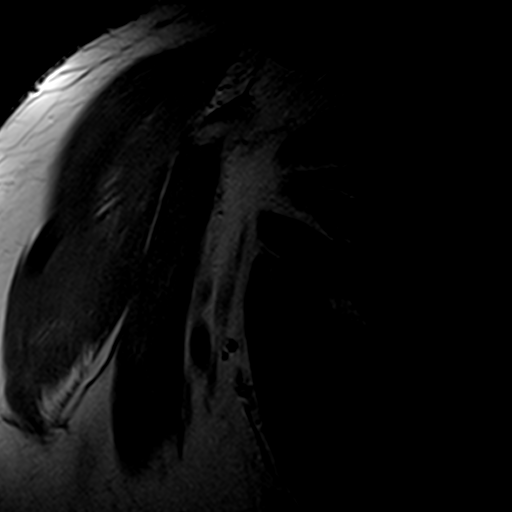
[im 4/20]
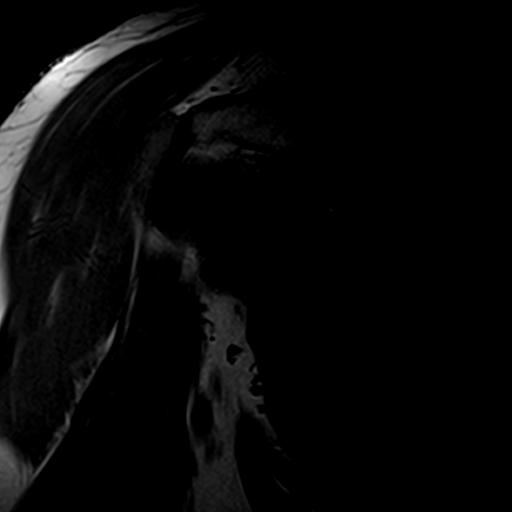
[im 7/20]
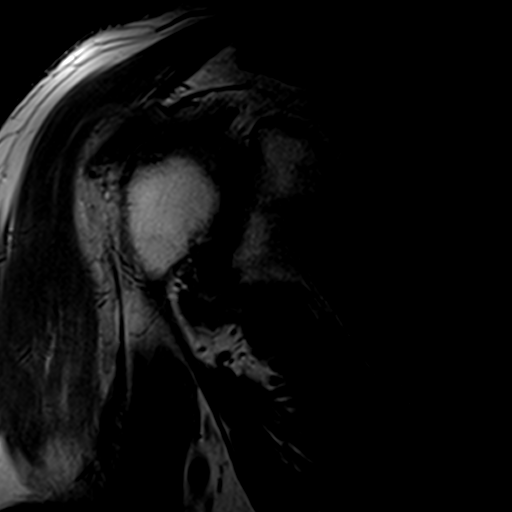
[im 10/20]
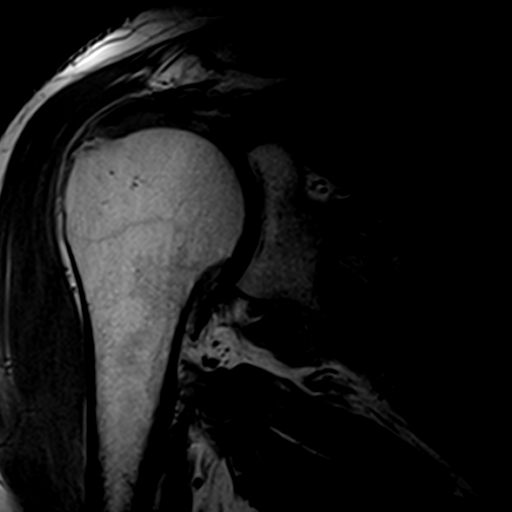
[im 13/20]
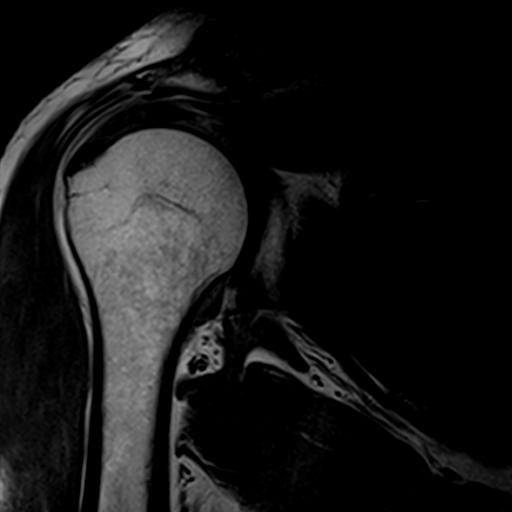
[im 16/20]
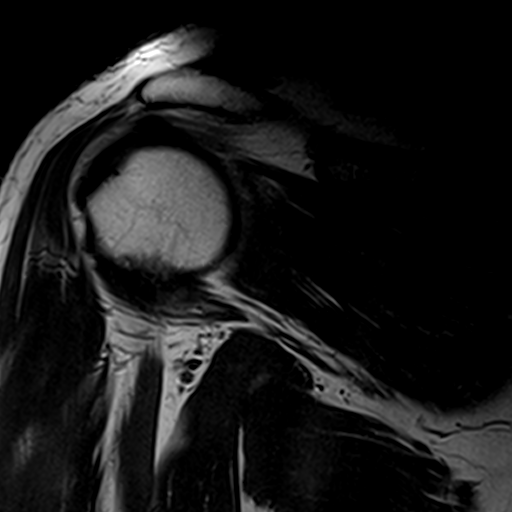
[im 20/20]
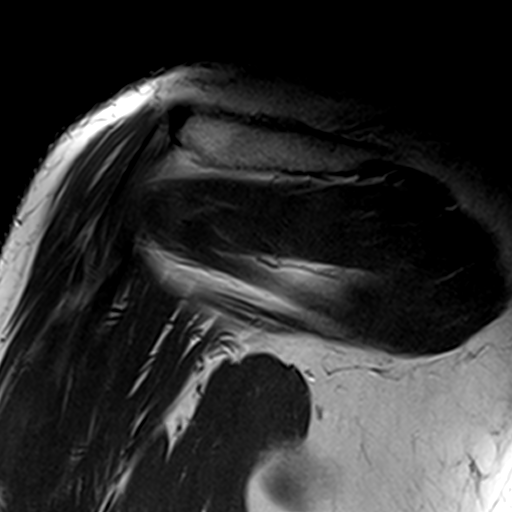

[Series 6: T1 · oblique · 3.0mm · 0.26mm/px · 6 of 24 slices shown]
[im 1/24]
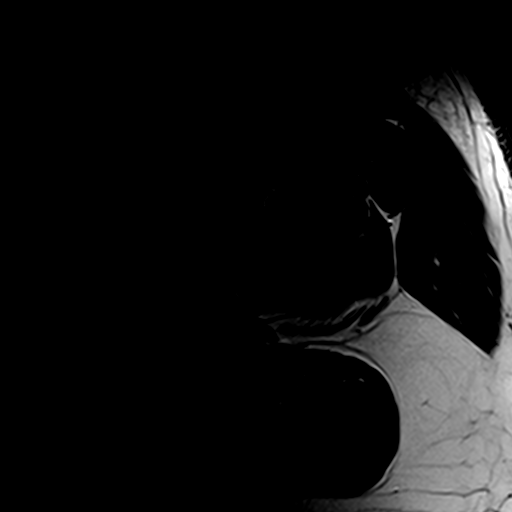
[im 3/24]
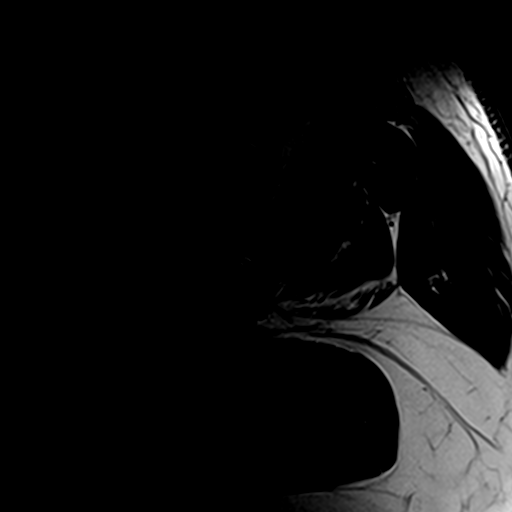
[im 6/24]
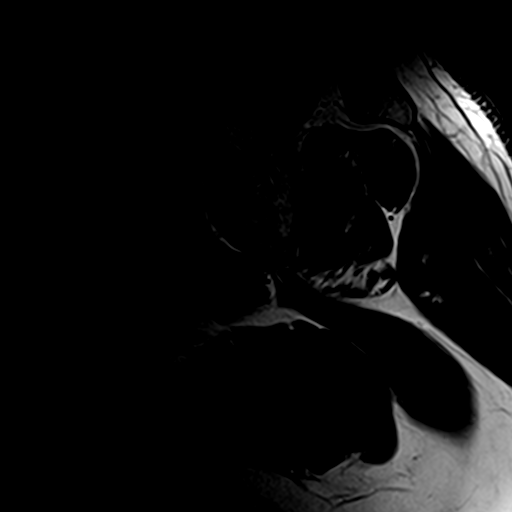
[im 9/24]
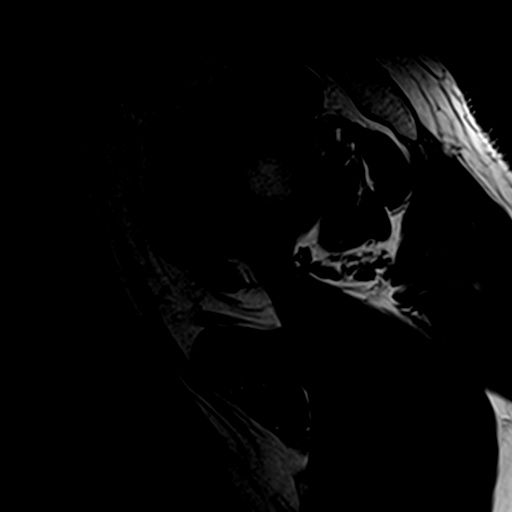
[im 12/24]
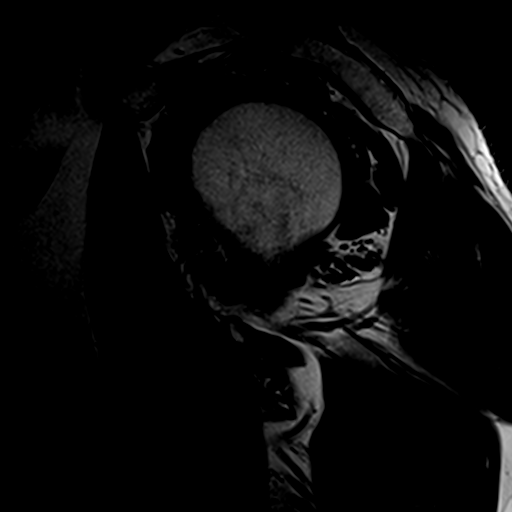
[im 21/24]
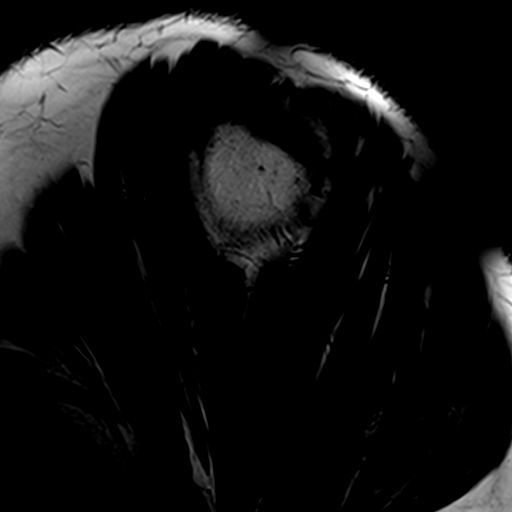

[19 of 40 positions shown; findings below may reference images not displayed]

FINDINGS: Rotator cuff: There is a focal linear non retracted full-thickness
tear of the distal supraspinous tendon. There is focal degeneration
of the undersurface of the tendon proximal to the tear.Remainder of
the rotator cuff is intact.

Muscles: No atrophy or abnormal signal of the muscles of the rotator
cuff.

Biceps long head:  Properly located and intact.

Acromioclavicular Joint: Normal. Type 2 acromion. Fluid in the
subacromial/subdeltoid bursa felt to be secondary to the
full-thickness rotator cuff tear.

Glenohumeral Joint: Normal.

Labrum:  Normal.

Bones:  Normal.

Other: None
IMPRESSION: Focal linear full-thickness non retracted tear of the supraspinous
tendon.

## 2018-09-04 DIAGNOSIS — J069 Acute upper respiratory infection, unspecified: Secondary | ICD-10-CM | POA: Diagnosis not present

## 2018-09-10 ENCOUNTER — Ambulatory Visit (INDEPENDENT_AMBULATORY_CARE_PROVIDER_SITE_OTHER): Payer: 59

## 2018-09-10 ENCOUNTER — Encounter: Payer: Self-pay | Admitting: Orthopedic Surgery

## 2018-09-10 ENCOUNTER — Ambulatory Visit (INDEPENDENT_AMBULATORY_CARE_PROVIDER_SITE_OTHER): Payer: 59 | Admitting: Orthopedic Surgery

## 2018-09-10 VITALS — BP 124/79 | HR 93 | Ht 70.0 in | Wt 242.0 lb

## 2018-09-10 DIAGNOSIS — G8929 Other chronic pain: Secondary | ICD-10-CM

## 2018-09-10 DIAGNOSIS — E119 Type 2 diabetes mellitus without complications: Secondary | ICD-10-CM | POA: Diagnosis not present

## 2018-09-10 DIAGNOSIS — M25512 Pain in left shoulder: Secondary | ICD-10-CM

## 2018-09-10 DIAGNOSIS — E669 Obesity, unspecified: Secondary | ICD-10-CM | POA: Diagnosis not present

## 2018-09-10 DIAGNOSIS — M7552 Bursitis of left shoulder: Secondary | ICD-10-CM

## 2018-09-10 DIAGNOSIS — J209 Acute bronchitis, unspecified: Secondary | ICD-10-CM | POA: Diagnosis not present

## 2018-09-10 NOTE — Progress Notes (Signed)
Patient ID: Jesus Ingram, male   DOB: 04/25/1961, 58 y.o.   MRN: [id number]  Chief Complaint  Patient presents with  . Shoulder Pain    Left shoulder for 2 months    HPI Jesus FillJeffrey N Ingram is a 58 y.o. male.  Presents with 3497-month history of left shoulder pain  Location peri-acromial Quality dull Severity mild Duration 2 months Timing occasional Modified worse with forward elevation  Review of Systems Review of Systems  Respiratory: Positive for cough.   Musculoskeletal: Negative for neck pain.  Skin: Negative.   Neurological: Negative for tingling.    Past Medical History:  Diagnosis Date  . Arthritis    denies  . Borderline diabetes   . Chronic back pain   . Diabetes mellitus without complication (HCC)   . GERD (gastroesophageal reflux disease)   . History of kidney stones    history of kidney stones.  . History of renal calculi   . Reflux    occasional, uses TUMS  . Restless leg syndrome   . Ruptured disc, thoracic   . Seasonal allergies   . Sleep apnea    lost weight and has gotten better    Past Surgical History:  Procedure Laterality Date  . CATARACT EXTRACTION W/PHACO  01/10/2012   Procedure: CATARACT EXTRACTION PHACO AND INTRAOCULAR LENS PLACEMENT (IOC);  Surgeon: Loraine LericheMark T. Nile RiggsShapiro, MD;  Location: [facility];  Service: Ophthalmology;  Laterality: Right;  CDE 9.77  . CATARACT EXTRACTION W/PHACO Left 01/13/2015   Procedure: CATARACT EXTRACTION PHACO AND INTRAOCULAR LENS PLACEMENT (IOC);  Surgeon: Jethro BolusMark Shapiro, MD;  Location: [facility];  Service: Ophthalmology;  Laterality: Left;  CDE:3.08  . COLONOSCOPY N/A 04/25/2013   Procedure: COLONOSCOPY;  Surgeon: Malissa HippoNajeeb U Rehman, MD;  Location: [facility];  Service: Endoscopy;  Laterality: N/A;  830  . CYSTOSCOPY/RETROGRADE/URETEROSCOPY/STONE EXTRACTION WITH BASKET  2011   with stents, [facility]; [facility]  . ELBOW ARTHROSCOPY W/ SYNOVECTOMY    . LITHOTRIPSY    . SHOULDER ARTHROSCOPY WITH ROTATOR CUFF REPAIR Right 11/03/2016    Procedure: SHOULDER ARTHROSCOPY WITH ROTATOR CUFF REPAIR;  Surgeon: Vickki HearingStanley E , MD;  Location: [facility];  Service: Orthopedics;  Laterality: Right;  . ULNAR TUNNEL RELEASE Right 07/14/2016   Procedure: CUBITAL TUNNEL RELEASE;  Surgeon: Vickki HearingStanley E , MD;  Location: [facility];  Service: Orthopedics;  Laterality: Right;  . WISDOM TOOTH EXTRACTION      Family History  Problem Relation Age of Onset  . Colon cancer Neg Hx      Social History   Tobacco Use  . Smoking status: Never Smoker  . Smokeless tobacco: Current User    Types: Chew  Substance Use Topics  . Alcohol use: No  . Drug use: No    Allergies  Allergen Reactions  . Morphine And Related Nausea And Vomiting    Patient prefers not to take to take    Allergies  Allergen Reactions  . Morphine And Related Nausea And Vomiting    Patient prefers not to take to take     Current Meds  Medication Sig  . cefdinir (OMNICEF) 300 MG capsule   . metFORMIN (GLUCOPHAGE) 500 MG tablet Take 500 mg by mouth every evening.   Marland Kitchen. rOPINIRole (REQUIP) 0.5 MG tablet Take 0.5 mg by mouth at bedtime as needed (restless legs).        Physical Exam BP 124/79   Pulse 93   Ht 5\' 10"  (1.778 m)   Wt 242 lb (109.8 kg)  BMI 34.72 kg/m  Physical Exam Vitals signs reviewed.  Constitutional:      Appearance: Normal appearance. He is well-developed.  Skin:    General: Skin is warm and dry.     Findings: No erythema.  Neurological:     Mental Status: He is alert and oriented to person, place, and time.  Psychiatric:        Mood and Affect: Mood and affect normal.    Ambulatory status normal with no assistive devices Right Shoulder Exam  Right shoulder exam is normal.  Tenderness  The patient is experiencing no tenderness.  Range of Motion  The patient has normal right shoulder ROM.  Muscle Strength  The patient has normal right shoulder strength.  Tests  Apprehension: negative  Other  Erythema:  absent Sensation: normal Pulse: present   Left Shoulder Exam  Left shoulder exam is normal.  Tenderness  Left shoulder tenderness location: Anterolateral acromion and deltoid.  Range of Motion  The patient has normal left shoulder ROM. Left shoulder forward flexion: Painful forward elevation after 120 degrees.   Muscle Strength  The patient has normal left shoulder strength.  Tests  Apprehension: negative Cross arm: negative Impingement: positive Drop arm: negative Sulcus: absent  Other  Erythema: absent Sensation: normal Pulse: present       PROVOCATIVE TESTS DROP ARM TEST normal PAINFUL ARC 120 degrees - 170 degrees EMPTY CAN -JOBST TEST normal EXTERNAL ROTATION LAG TEST normal LIFT OFF TEST normal BELLYPRESS TEST normal   MEDICAL DECISION SECTION  X-ray was done in the office it shows a normal shoulder with a type II acromion  Encounter Diagnoses  Name Primary?  . Chronic left shoulder pain Yes  . Bursitis of left shoulder      PLAN:   Subacromial injection left shoulder  Procedure note the subacromial injection shoulder left   Verbal consent was obtained to inject the  Left   Shoulder  Timeout was completed to confirm the injection site is a subacromial space of the  left  shoulder  Medication used Depo-Medrol 40 mg and lidocaine 1% 3 cc  Anesthesia was provided by ethyl chloride  The injection was performed in the left  posterior subacromial space. After pinning the skin with alcohol and anesthetized the skin with ethyl chloride the subacromial space was injected using a 20-gauge needle. There were no complications  Sterile dressing was applied.          Follow-up PRN  No orders of the defined types were placed in this encounter.

## 2018-09-10 NOTE — Patient Instructions (Signed)

## 2018-09-19 DIAGNOSIS — J209 Acute bronchitis, unspecified: Secondary | ICD-10-CM | POA: Diagnosis not present

## 2018-09-19 DIAGNOSIS — E669 Obesity, unspecified: Secondary | ICD-10-CM | POA: Diagnosis not present

## 2018-09-19 DIAGNOSIS — E119 Type 2 diabetes mellitus without complications: Secondary | ICD-10-CM | POA: Diagnosis not present

## 2018-09-19 LAB — HEMOGLOBIN A1C: Hemoglobin A1C: 7

## 2018-09-28 DIAGNOSIS — K219 Gastro-esophageal reflux disease without esophagitis: Secondary | ICD-10-CM | POA: Diagnosis not present

## 2018-09-28 DIAGNOSIS — Z0000 Encounter for general adult medical examination without abnormal findings: Secondary | ICD-10-CM | POA: Diagnosis not present

## 2018-09-28 DIAGNOSIS — E119 Type 2 diabetes mellitus without complications: Secondary | ICD-10-CM | POA: Diagnosis not present

## 2018-09-28 DIAGNOSIS — E669 Obesity, unspecified: Secondary | ICD-10-CM | POA: Diagnosis not present

## 2018-09-28 LAB — CBC AND DIFFERENTIAL
HCT: 47 (ref 41–53)
Hemoglobin: 16.3 (ref 13.5–17.5)
Platelets: 197 (ref 150–399)
WBC: 8.4

## 2018-09-28 LAB — CBC: RBC: 5.43 — AB (ref 3.87–5.11)

## 2018-09-28 LAB — LIPID PANEL
Cholesterol: 201 — AB (ref 0–200)
HDL: 54 (ref 35–70)
LDL Cholesterol: 123
Triglycerides: 122 (ref 40–160)

## 2018-09-28 LAB — HEPATIC FUNCTION PANEL
ALT: 14 (ref 10–40)
AST: 9 — AB (ref 14–40)
Alkaline Phosphatase: 61 (ref 25–125)

## 2018-09-28 LAB — TSH: TSH: 3.09 (ref <=5.90)

## 2018-09-28 LAB — COMPREHENSIVE METABOLIC PANEL
Albumin: 4.8 (ref 3.5–5.0)
Calcium: 9.7 (ref 8.7–10.7)

## 2018-09-28 LAB — BASIC METABOLIC PANEL
BUN: 13 (ref 4–21)
Creatinine: 1.4 — AB (ref <=1.3)
Glucose: 116

## 2018-09-28 LAB — PSA: PSA: 1.2

## 2018-10-02 DIAGNOSIS — Z0000 Encounter for general adult medical examination without abnormal findings: Secondary | ICD-10-CM | POA: Diagnosis not present

## 2019-02-14 LAB — BASIC METABOLIC PANEL
BUN: 18 (ref 4–21)
Creatinine: 1.6 — AB (ref 0.6–1.3)
Glucose: 183
Potassium: 4.4 (ref 3.4–5.3)
Sodium: 140 (ref 137–147)

## 2019-02-14 LAB — HEMOGLOBIN A1C: Hemoglobin A1C: 6.5

## 2019-02-18 ENCOUNTER — Ambulatory Visit: Payer: 59 | Admitting: Orthopedic Surgery

## 2019-02-18 ENCOUNTER — Encounter: Payer: Self-pay | Admitting: Orthopedic Surgery

## 2019-02-18 ENCOUNTER — Other Ambulatory Visit: Payer: Self-pay

## 2019-02-18 VITALS — BP 130/77 | HR 96 | Temp 98.1°F | Ht 70.0 in | Wt 239.0 lb

## 2019-02-18 DIAGNOSIS — G5602 Carpal tunnel syndrome, left upper limb: Secondary | ICD-10-CM

## 2019-02-18 MED ORDER — VITAMIN B-6 100 MG PO TABS: 100.0000 mg | ORAL_TABLET | Freq: Two times a day (BID) | ORAL | 1 refills | Status: DC

## 2019-02-18 MED ORDER — GABAPENTIN 100 MG PO CAPS: 100.0000 mg | ORAL_CAPSULE | Freq: Every day | ORAL | 2 refills | Status: DC

## 2019-02-18 NOTE — Patient Instructions (Signed)
Carpal Tunnel Syndrome  Carpal tunnel syndrome is a condition that causes pain in your hand and arm. The carpal tunnel is a narrow area that is on the palm side of your wrist. Repeated wrist motion or certain diseases may cause swelling in the tunnel. This swelling can pinch the main nerve in the wrist (median nerve). What are the causes? This condition may be caused by:  Repeated wrist motions.  Wrist injuries.  Arthritis.  A sac of fluid (cyst) or abnormal growth (tumor) in the carpal tunnel.  Fluid buildup during pregnancy. Sometimes the cause is not known. What increases the risk? The following factors may make you more likely to develop this condition:  Having a job in which you move your wrist in the same way many times. This includes jobs like being a butcher or a cashier.  Being a woman.  Having other health conditions, such as: ? Diabetes. ? Obesity. ? A thyroid gland that is not active enough (hypothyroidism). ? Kidney failure. What are the signs or symptoms? Symptoms of this condition include:  A tingling feeling in your fingers.  Tingling or a loss of feeling (numbness) in your hand.  Pain in your entire arm. This pain may get worse when you bend your wrist and elbow for a long time.  Pain in your wrist that goes up your arm to your shoulder.  Pain that goes down into your palm or fingers.  A weak feeling in your hands. You may find it hard to grab and hold items. You may feel worse at night. How is this diagnosed? This condition is diagnosed with a medical history and physical exam. You may also have tests, such as:  Electromyogram (EMG). This test checks the signals that the nerves send to the muscles.  Nerve conduction study. This test checks how well signals pass through your nerves.  Imaging tests, such as X-rays, ultrasound, and MRI. These tests check for what might be the cause of your condition. How is this treated? This condition may be treated  with:  Lifestyle changes. You will be asked to stop or change the activity that caused your problem.  Doing exercise and activities that make bones and muscles stronger (physical therapy).  Learning how to use your hand again (occupational therapy).  Medicines for pain and swelling (inflammation). You may have injections in your wrist.  A wrist splint.  Surgery. Follow these instructions at home: If you have a splint:  Wear the splint as told by your doctor. Remove it only as told by your doctor.  Loosen the splint if your fingers: ? Tingle. ? Lose feeling (become numb). ? Turn cold and blue.  Keep the splint clean.  If the splint is not waterproof: ? Do not let it get wet. ? Cover it with a watertight covering when you take a bath or a shower. Managing pain, stiffness, and swelling   If told, put ice on the painful area: ? If you have a removable splint, remove it as told by your doctor. ? Put ice in a plastic bag. ? Place a towel between your skin and the bag. ? Leave the ice on for 20 minutes, 2-3 times per day. General instructions  Take over-the-counter and prescription medicines only as told by your doctor.  Rest your wrist from any activity that may cause pain. If needed, talk with your boss at work about changes that can help your wrist heal.  Do any exercises as told by your doctor,   physical therapist, or occupational therapist.  Keep all follow-up visits as told by your doctor. This is important. Contact a doctor if:  You have new symptoms.  Medicine does not help your pain.  Your symptoms get worse. Get help right away if:  You have very bad numbness or tingling in your wrist or hand. Summary  Carpal tunnel syndrome is a condition that causes pain in your hand and arm.  It is often caused by repeated wrist motions.  Lifestyle changes and medicines are used to treat this problem. Surgery may help in very bad cases.  Follow your doctor's  instructions about wearing a splint, resting your wrist, keeping follow-up visits, and calling for help. This information is not intended to replace advice given to you by your health care provider. Make sure you discuss any questions you have with your health care provider. Document Released: 07/21/2011 Document Revised: 12/08/2017 Document Reviewed: 12/08/2017 [employer]vier Patient Education  2020 [employer]  

## 2019-02-18 NOTE — Progress Notes (Signed)
Patient ID: Jesus Ingram, male   DOB: 02-Feb-1961, 58 y.o.   MRN: [id number]  Chief Complaint  Patient presents with  . Hand Problem    left hand numb/ tingling     HPI Jesus Ingram is a 58 y.o. male.  Presents for evaluation of left upper extremity numbness and tingling.  The patient says that he has had intermittent numbness and tingling in his left hand with some pain in the forearm pins and needlelike feeling since December January with no history of trauma exacerbated by driving and flexing his wrist no treatment no other factors seems to be actually improving.  Review of Systems Review of Systems  Respiratory: Positive for apnea.   All other systems reviewed and are negative.    Past Medical History:  Diagnosis Date  . Arthritis    denies  . Borderline diabetes   . Chronic back pain   . Diabetes mellitus without complication ([place])   . GERD (gastroesophageal reflux disease)   . History of kidney stones    history of kidney stones.  . History of renal calculi   . Reflux    occasional, uses TUMS  . Restless leg syndrome   . Ruptured disc, thoracic   . Seasonal allergies   . Sleep apnea    lost weight and has gotten better    Past Surgical History:  Procedure Laterality Date  . CATARACT EXTRACTION W/PHACO  01/10/2012   Procedure: CATARACT EXTRACTION PHACO AND INTRAOCULAR LENS PLACEMENT (IOC);  Surgeon: Elta Guadeloupe T. Gershon Crane, MD;  Location: [facility];  Service: Ophthalmology;  Laterality: Right;  CDE 9.77  . CATARACT EXTRACTION W/PHACO Left 01/13/2015   Procedure: CATARACT EXTRACTION PHACO AND INTRAOCULAR LENS PLACEMENT (IOC);  Surgeon: Rutherford Guys, MD;  Location: [facility];  Service: Ophthalmology;  Laterality: Left;  CDE:3.08  . COLONOSCOPY N/A 04/25/2013   Procedure: COLONOSCOPY;  Surgeon: Rogene Houston, MD;  Location: [facility];  Service: Endoscopy;  Laterality: N/A;  830  . CYSTOSCOPY/RETROGRADE/URETEROSCOPY/STONE EXTRACTION WITH BASKET  2011   with stents, [place];  [facility]  . ELBOW ARTHROSCOPY W/ SYNOVECTOMY    . LITHOTRIPSY    . SHOULDER ARTHROSCOPY WITH ROTATOR CUFF REPAIR Right 11/03/2016   Procedure: SHOULDER ARTHROSCOPY WITH ROTATOR CUFF REPAIR;  Surgeon: Carole Civil, MD;  Location: [facility];  Service: Orthopedics;  Laterality: Right;  . ULNAR TUNNEL RELEASE Right 07/14/2016   Procedure: CUBITAL TUNNEL RELEASE;  Surgeon: Carole Civil, MD;  Location: [facility];  Service: Orthopedics;  Laterality: Right;  . WISDOM TOOTH EXTRACTION      Family History  Problem Relation Age of Onset  . Colon cancer Neg Hx      Social History   Tobacco Use  . Smoking status: Never Smoker  . Smokeless tobacco: Current User    Types: Chew  Substance Use Topics  . Alcohol use: No  . Drug use: No    Allergies  Allergen Reactions  . Morphine And Related Nausea And Vomiting    Patient prefers not to take to take    Current Outpatient Medications  Medication Sig Dispense Refill  . calcium carbonate (TUMS - DOSED IN MG ELEMENTAL CALCIUM) 500 MG chewable tablet Chew 2 tablets by mouth daily as needed for indigestion or heartburn.     . fluticasone (FLONASE) 50 MCG/ACT nasal spray Place into both nostrils daily.    . metFORMIN (GLUCOPHAGE) 500 MG tablet Take 500 mg by mouth every evening.   3  . methocarbamol (  ROBAXIN) 500 MG tablet Take 1 tablet (500 mg total) by mouth 4 (four) times daily. 60 tablet 1  . nabumetone (RELAFEN) 750 MG tablet Take 750 mg by mouth daily as needed for moderate pain.     Marland Kitchen. rOPINIRole (REQUIP) 0.5 MG tablet Take 0.5 mg by mouth at bedtime as needed (restless legs).     . tamsulosin (FLOMAX) 0.4 MG CAPS capsule Take 1 capsule (0.4 mg total) daily by mouth. 14 capsule 0  . gabapentin (NEURONTIN) 100 MG capsule Take 1 capsule (100 mg total) by mouth at bedtime. 42 capsule 2  . pyridOXINE (VITAMIN B-6) 100 MG tablet Take 1 tablet (100 mg total) by mouth 2 (two) times daily. 60 tablet 1   No current facility-administered  medications for this visit.      Physical Exam BP 130/77   Pulse 96   Temp 98.1 F (36.7 C)   Ht 5\' 10"  (1.778 m)   Wt 239 lb (108.4 kg)   BMI 34.29 kg/m  Physical Exam The patient is well developed well nourished and well groomed.  Orientation to person place and time is normal  Mood is pleasant.  Ambulatory status normal gait and stance   Left upper extremity examination reveals the following:  Inspection reveals no swelling. There is tenderness over the carpal tunnel.  Range of motion of the wrist and elbow are normal  Motor exam shows mild weakness with grip strength.  Wrist joint is stable  Provocative tests for carpal tunnel Phalen's test negative Carpal tunnel compression test neg Tinel's test neg  Pulses are normal in the radial and ulnar artery with a normal Allen's test.  Decreased sensation is noted in the thumb and index finger with normal soft touch Opposite extremity normal range of motion stability and strength   Plan     MEDICAL DECISION SECTION  Carpal tunnel nerve conduction study. No     Encounter Diagnosis  Name Primary?  . Carpal tunnel syndrome of left wrist Yes     PLAN:   Meds ordered this encounter  Medications  . gabapentin (NEURONTIN) 100 MG capsule    Sig: Take 1 capsule (100 mg total) by mouth at bedtime.    Dispense:  42 capsule    Refill:  2  . pyridOXINE (VITAMIN B-6) 100 MG tablet    Sig: Take 1 tablet (100 mg total) by mouth 2 (two) times daily.    Dispense:  60 tablet    Refill:  1   Injection? no MRI/CT/? no   Wrist splint Gabapentin  Vit B 6  F/U 6 wks

## 2019-03-12 ENCOUNTER — Other Ambulatory Visit: Payer: Self-pay | Admitting: Orthopedic Surgery

## 2019-03-12 DIAGNOSIS — G5602 Carpal tunnel syndrome, left upper limb: Secondary | ICD-10-CM

## 2019-04-01 ENCOUNTER — Ambulatory Visit: Payer: 59 | Admitting: Orthopedic Surgery

## 2019-04-10 ENCOUNTER — Ambulatory Visit (INDEPENDENT_AMBULATORY_CARE_PROVIDER_SITE_OTHER): Payer: 59 | Admitting: Orthopedic Surgery

## 2019-04-10 ENCOUNTER — Other Ambulatory Visit: Payer: Self-pay

## 2019-04-10 VITALS — BP 114/72 | HR 82 | Temp 97.3°F | Ht 72.0 in | Wt 227.0 lb

## 2019-04-10 DIAGNOSIS — G5602 Carpal tunnel syndrome, left upper limb: Secondary | ICD-10-CM | POA: Diagnosis not present

## 2019-04-10 NOTE — Progress Notes (Signed)
Progress Note   Patient ID: Jesus Ingram, male   DOB: [birth year], 58 y.o.   MRN: [id number]   Chief Complaint  Patient presents with  . Carpal Tunnel    Recheck on left CTS.    Encounter Diagnosis  Name Primary?  . Carpal tunnel syndrome of left wrist Yes    58 year old male follows up for carpal tunnel syndrome he had B6 gabapentin 100 mg at night and a wrist splint he is here for 6-week follow-up he says his symptoms have resolved      ROS    BP 114/72   Pulse 82   Ht 6' (1.829 m)   Wt 227 lb (103 kg)   BMI 30.79 kg/m   Physical Exam Musculoskeletal:     Comments: He has normal grip strength no numbness or tingling no swelling no carpal tunnel tenderness      Medical decisions:  (Established problem worse, x-ray ,physical therapy, over-the-counter medicines, read outside film or summarize x-ray)  Data  Imaging:   Not applicable  Encounter Diagnosis  Name Primary?  . Carpal tunnel syndrome of left wrist Yes    PLAN:   Normal activities follow-up as needed    Arther Abbott, MD 04/10/2019 9:28 AM

## 2019-05-02 ENCOUNTER — Other Ambulatory Visit: Payer: Self-pay

## 2019-05-02 ENCOUNTER — Ambulatory Visit: Payer: 59 | Admitting: Family Medicine

## 2019-05-02 ENCOUNTER — Encounter: Payer: Self-pay | Admitting: Family Medicine

## 2019-05-02 VITALS — BP 138/81 | HR 90 | Temp 98.5°F | Ht 72.0 in | Wt 230.6 lb

## 2019-05-02 DIAGNOSIS — M5136 Other intervertebral disc degeneration, lumbar region: Secondary | ICD-10-CM | POA: Insufficient documentation

## 2019-05-02 DIAGNOSIS — E1122 Type 2 diabetes mellitus with diabetic chronic kidney disease: Secondary | ICD-10-CM | POA: Diagnosis not present

## 2019-05-02 DIAGNOSIS — N200 Calculus of kidney: Secondary | ICD-10-CM | POA: Insufficient documentation

## 2019-05-02 DIAGNOSIS — K219 Gastro-esophageal reflux disease without esophagitis: Secondary | ICD-10-CM

## 2019-05-02 DIAGNOSIS — J302 Other seasonal allergic rhinitis: Secondary | ICD-10-CM | POA: Insufficient documentation

## 2019-05-02 DIAGNOSIS — G5621 Lesion of ulnar nerve, right upper limb: Secondary | ICD-10-CM

## 2019-05-02 LAB — POCT UA - MICROALBUMIN
A1c: <30
Creatinine, POC: 300 mg/dL
Microalbumin Ur, POC: 10 mg/L

## 2019-05-02 MED ORDER — SITAGLIPTIN PHOSPHATE 50 MG PO TABS: 50.0000 mg | ORAL_TABLET | Freq: Every day | ORAL | 2 refills | Status: DC

## 2019-05-02 NOTE — Patient Instructions (Addendum)
Nephrology appt- our office will call for the appointment  [facility] prior to next appt for fasting labwork  Schedule diabetic eye exam  Changing from metformin to [facility] daily for diabetes Stop taking Relafen

## 2019-05-02 NOTE — Progress Notes (Addendum)
New Patient Office Visit  Subjective:  Patient ID: Jesus Ingram, male    DOB: 1960-08-27  Age: 58 y.o. MRN: [id number]  CC:  Chief Complaint  Patient presents with  . New Patient (Initial Visit)  . Diabetes    HPI Jesus Ingram presents for DM-takes metformin daily-previously controlled with diet modification.  No recent eye exam-needs appt Contacted by Dr Jesus Ingram to see nephro due to abnormal renal function. Cr 1.61, GFR 47 Takes relafen for pain-DDD  Uses CPAP for sleep apnea -pulmonary function test normal-2018 Interpretation: 1. spirometry is normal with no bronchodilator improvement. 2. lung volumes are normal. 3. airway resistance is normal 4.DLCO minimally reduced  GERD-watches late night eating-works second shift-tums  Kidney stones-followed by urology  Carpal tunnel left wrist-night splints-sees ortho [facility]G  Past Medical History:  Diagnosis Date  . Arthritis    denies  . Borderline diabetes   . Chronic back pain   . Diabetes mellitus without complication (H[facility])   . GERD (gastroesophageal reflux disease)   . History of kidney stones    history of kidney stones.  . History of renal calculi   . Reflux    occasional, uses TUMS  . Restless leg syndrome   . Ruptured disc, thoracic   . Seasonal allergies   . Sleep apnea    lost weight and has gotten better    Past Surgical History:  Procedure Laterality Date  . CATARACT EXTRACTION W/PHACO  01/10/2012   Procedure: CATARACT EXTRACTION PHACO AND INTRAOCULAR LENS PLACEMENT (IOC);  Surgeon: Loraine Leriche T. Nile Riggs, MD;  Location: [facility];  Service: Ophthalmology;  Laterality: Right;  CDE 9.77  . CATARACT EXTRACTION W/PHACO Left 01/13/2015   Procedure: CATARACT EXTRACTION PHACO AND INTRAOCULAR LENS PLACEMENT (IOC);  Surgeon: Jethro Bolus, MD;  Location: [facility];  Service: Ophthalmology;  Laterality: Left;  CDE:3.08  . COLONOSCOPY N/A 04/25/2013   Procedure: COLONOSCOPY;  Surgeon: Malissa Hippo, MD;  [facility];  Service: Endoscopy;  Laterality: N/A;  830  . CYSTOSCOPY/RETROGRADE/URETEROSCOPY/STONE EXTRACTION WITH BASKET  2011   with stents, [facility]; [facility]  . ELBOW ARTHROSCOPY W/ SYNOVECTOMY    . LITHOTRIPSY    . SHOULDER ARTHROSCOPY WITH ROTATOR CUFF REPAIR Right 11/03/2016   Procedure: SHOULDER ARTHROSCOPY WITH ROTATOR CUFF REPAIR;  Surgeon: Vickki Hearing, MD;  Location: [facility];  Service: Orthopedics;  Laterality: Right;  . ULNAR TUNNEL RELEASE Right 07/14/2016   Procedure: CUBITAL TUNNEL RELEASE;  Surgeon: Vickki Hearing, MD;  Location: [facility];  Service: Orthopedics;  Laterality: Right;  . WISDOM TOOTH EXTRACTION      Family History  Problem Relation Age of Onset  . Colon cancer Neg Hx     Social History   Socioeconomic History  . Marital status: Married    Spouse name: Not on file  . Number of children: Not on file  . Years of education: Not on file  . Highest education level: Not on file  Occupational History  . Not on file  Social Needs  . Financial resource strain: Not on file  . Food insecurity    Worry: Not on file    Inability: Not on file  . Transportation needs    Medical: Not on file    Non-medical: Not on file  Tobacco Use  . Smoking status: Never Smoker  . Smokeless tobacco: Current User    Types: Chew  Substance and Sexual Activity  . Alcohol use: No  . Drug use: No  .  Sexual activity: Yes    Birth control/protection: None  Lifestyle  . Physical activity    Days per week: Not on file    Minutes per session: Not on file  . Stress: Not on file  Relationships  . Social Musicianconnections    Talks on phone: Not on file    Gets together: Not on file    Attends religious service: Not on file    Active member of club or organization: Not on file    Attends meetings of clubs or organizations: Not on file    Relationship status: Not on file  . Intimate partner violence    Fear of current or ex partner: Not on file    Emotionally abused: Not on file     Physically abused: Not on file    Forced sexual activity: Not on file  Other Topics Concern  . Not on file  Social History Narrative  . Not on file    ROS Review of Systems  Constitutional: Positive for unexpected weight change. Negative for fatigue.       Gained 30 lbs with stopping chewing tobacco  HENT: Positive for congestion, dental problem and sinus pressure.   Eyes:       Cataract surgery  Respiratory:       Sleep apnea-uses CPAP  Cardiovascular: Negative.   Gastrointestinal:       GERD-uses TUMS  Endocrine:       DM-oral meds-hypogly  Genitourinary:       Kidney stones  Musculoskeletal: Positive for arthralgias and back pain.       Carpal tunnel-left wrist  Allergic/Immunologic: Positive for environmental allergies.  Neurological: Positive for syncope.       Syncope -? hypoglycemia  Hematological: Negative.   Psychiatric/Behavioral: Negative.     Objective:   Today's Vitals: BP 138/81 (BP Location: Left Arm, Patient Position: Sitting, Cuff Size: Normal)   Pulse 90   Temp 98.5 F (36.9 C) (Oral)   Ht 6' (1.829 m)   Wt 230 lb 9.6 oz (104.6 kg)   SpO2 93%   BMI 31.27 kg/m   Physical Exam Constitutional:      Appearance: He is obese.  HENT:     Head: Normocephalic and atraumatic.  Eyes:     Conjunctiva/sclera: Conjunctivae normal.  Neck:     Musculoskeletal: Normal range of motion and neck supple.  Cardiovascular:     Rate and Rhythm: Normal rate and regular rhythm.     Pulses: Normal pulses.     Heart sounds: Normal heart sounds.  Pulmonary:     Effort: Pulmonary effort is normal.     Breath sounds: Normal breath sounds.  Musculoskeletal:     Right lower leg: No edema.     Left lower leg: No edema.  Neurological:     General: No focal deficit present.     Mental Status: He is alert and oriented to person, place, and time.  Psychiatric:        Mood and Affect: Mood normal.        Behavior: Behavior normal.     Assessment & Plan:    Outpatient Encounter Medications as of 05/02/2019  Medication Sig  . calcium carbonate (TUMS - DOSED IN MG ELEMENTAL CALCIUM) 500 MG chewable tablet Chew 2 tablets by mouth daily as needed for indigestion or heartburn.   . fluticasone (FLONASE) 50 MCG/ACT nasal spray Place into both nostrils daily.  Marland Kitchen. gabapentin (NEURONTIN) 100 MG capsule TAKE 1 CAPSULE BY  MOUTH AT BEDTIME.  . metFORMIN (GLUCOPHAGE) 500 MG tablet Take 500 mg by mouth every evening.   . methocarbamol (ROBAXIN) 500 MG tablet Take 1 tablet (500 mg total) by mouth 4 (four) times daily. (Patient not taking: Reported on 04/10/2019)  . nabumetone (RELAFEN) 750 MG tablet Take 750 mg by mouth daily as needed for moderate pain.   Marland Kitchen pyridOXINE (VITAMIN B-6) 100 MG tablet Take 1 tablet (100 mg total) by mouth 2 (two) times daily.  Marland Kitchen rOPINIRole (REQUIP) 0.5 MG tablet Take 0.5 mg by mouth at bedtime as needed (restless legs).   . tamsulosin (FLOMAX) 0.4 MG CAPS capsule Take 1 capsule (0.4 mg total) daily by mouth.   No facility-administered encounter medications on file as of 05/02/2019.    1. Cubital tunnel syndrome on right Ortho following  2. Type 2 diabetes mellitus with chronic kidney disease, without long-term current use of insulin, unspecified CKD stage ([place]) Change from metformin to Januvia-Cr elevated  3. Gastroesophageal reflux disease without esophagitis Diet modification 4. Nephrolithiasis Ongoing-concern for chronic stones 5. Seasonal allergic rhinitis, unspecified trigger  6. DDD (degenerative disc disease), lumbar Stop taking Relafen-concern for affect on renal function   LISA Hannah Beat, MD

## 2019-07-01 ENCOUNTER — Other Ambulatory Visit: Payer: Self-pay

## 2019-07-01 ENCOUNTER — Ambulatory Visit: Payer: 59 | Admitting: Family Medicine

## 2019-07-01 ENCOUNTER — Ambulatory Visit (INDEPENDENT_AMBULATORY_CARE_PROVIDER_SITE_OTHER): Payer: 59 | Admitting: Family Medicine

## 2019-07-01 ENCOUNTER — Encounter: Payer: Self-pay | Admitting: Family Medicine

## 2019-07-01 VITALS — BP 120/83 | HR 98 | Temp 98.8°F | Resp 15 | Ht 66.0 in | Wt 236.2 lb

## 2019-07-01 DIAGNOSIS — E1122 Type 2 diabetes mellitus with diabetic chronic kidney disease: Secondary | ICD-10-CM

## 2019-07-01 DIAGNOSIS — R899 Unspecified abnormal finding in specimens from other organs, systems and tissues: Secondary | ICD-10-CM | POA: Insufficient documentation

## 2019-07-01 DIAGNOSIS — J0100 Acute maxillary sinusitis, unspecified: Secondary | ICD-10-CM | POA: Diagnosis not present

## 2019-07-01 MED ORDER — AMOXICILLIN-POT CLAVULANATE 875-125 MG PO TABS: 1.0000 | ORAL_TABLET | Freq: Two times a day (BID) | ORAL | 0 refills | Status: DC

## 2019-07-01 NOTE — Patient Instructions (Signed)
Augmentin -twice a day mucinex 600mg  12 hour Hold on zyrtec Consider Neti-Pot-nasal saline rinses

## 2019-07-01 NOTE — Progress Notes (Signed)
Acute Office Visit  Subjective:    Patient ID: Jesus Ingram, male    DOB: 1961-04-08, 58 y.o.   MRN: [id number]  Chief Complaint  Patient presents with  . sinus pressure    started Wednesday    HPI Patient is in today for sinus pressure and headache. Pt states onset last Wed. Pt with no fever. Taking zyrtec last week for congestion. Pt states he often has infections this time of year. Occasionally he coughs thick mucous. Pt with no ear pressure. Facial pressure under eyes. Pt with concern for DM change from metformin to Januvia. Pt states expensive medication. Pt with elevated Cr-appt for nephrology not scheduled-pt inquiry about appt. Pt sees urology for kidney stones. Pt states he passed stones in the past  Past Medical History:  Diagnosis Date  . Arthritis    denies  . Borderline diabetes   . Chronic back pain   . Diabetes mellitus without complication ([facility])   . GERD (gastroesophageal reflux disease)   . History of kidney stones    history of kidney stones.  . History of renal calculi   . Reflux    occasional, uses TUMS  . Restless leg syndrome   . Ruptured disc, thoracic   . Seasonal allergies   . Sleep apnea    lost weight and has gotten better    Past Surgical History:  Procedure Laterality Date  . CATARACT EXTRACTION W/PHACO  01/10/2012   Procedure: CATARACT EXTRACTION PHACO AND INTRAOCULAR LENS PLACEMENT (IOC);  Surgeon: Loraine LericheMark T. Nile RiggsShapiro, MD;  Location: [facility];  Service: Ophthalmology;  Laterality: Right;  CDE 9.77  . CATARACT EXTRACTION W/PHACO Left 01/13/2015   Procedure: CATARACT EXTRACTION PHACO AND INTRAOCULAR LENS PLACEMENT (IOC);  Surgeon: Jethro BolusMark Shapiro, MD;  Location: [facility];  Service: Ophthalmology;  Laterality: Left;  CDE:3.08  . COLONOSCOPY N/A 04/25/2013   Procedure: COLONOSCOPY;  Surgeon: Malissa HippoNajeeb U Rehman, MD;  Location[facility];  Service: Endoscopy;  Laterality: N/A;  830  . CYSTOSCOPY/RETROGRADE/URETEROSCOPY/STONE EXTRACTION WITH BASKET  2011    with stents, [facility]; [place]  . ELBOW ARTHROSCOPY W/ SYNOVECTOMY    . LITHOTRIPSY    . SHOULDER ARTHROSCOPY WITH ROTATOR CUFF REPAIR Right 11/03/2016   Procedure: SHOULDER ARTHROSCOPY WITH ROTATOR CUFF REPAIR;  Surgeon: Vickki HearingStanley E Harrison, MD;  Location: [facility];  Service: Orthopedics;  Laterality: Right;  . ULNAR TUNNEL RELEASE Right 07/14/2016   Procedure: CUBITAL TUNNEL RELEASE;  Surgeon: Vickki HearingStanley E Harrison, MD;  Location: [facility];  Service: Orthopedics;  Laterality: Right;  . WISDOM TOOTH EXTRACTION      Family History  Problem Relation Age of Onset  . Diabetes Father   . Diabetes Maternal Grandmother   . Colon cancer Neg Hx     Social History   Socioeconomic History  . Marital status: Married    Spouse name: Not on file  . Number of children: Not on file  . Years of education: Not on file  . Highest education level: Not on file  Occupational History  . Occupation: Curatormechanic   Social Needs  . Financial resource strain: Not on file  . Food insecurity    Worry: Not on file    Inability: Not on file  . Transportation needs    Medical: Not on file    Non-medical: Not on file  Tobacco Use  . Smoking status: Never Smoker  . Smokeless tobacco: Current User    Types: Chew  Substance and Sexual Activity  . Alcohol use:  No  . Drug use: No  . Sexual activity: Yes    Birth control/protection: None  Lifestyle  . Physical activity    Days per week: Not on file    Minutes per session: Not on file  . Stress: Not on file  Relationships  . Social Herbalist on phone: Not on file    Gets together: Not on file    Attends religious service: Not on file    Active member of club or organization: Not on file    Attends meetings of clubs or organizations: Not on file    Relationship status: Not on file  . Intimate partner violence    Fear of current or ex partner: Not on file    Emotionally abused: Not on file    Physically abused: Not on file    Forced sexual activity:  Not on file  Other Topics Concern  . Not on file  Social History Narrative  . Not on file    Outpatient Medications Prior to Visit  Medication Sig Dispense Refill  . calcium carbonate (TUMS - DOSED IN MG ELEMENTAL CALCIUM) 500 MG chewable tablet Chew 2 tablets by mouth daily as needed for indigestion or heartburn.     . fluticasone (FLONASE) 50 MCG/ACT nasal spray Place into both nostrils daily.    Marland Kitchen gabapentin (NEURONTIN) 100 MG capsule TAKE 1 CAPSULE BY MOUTH AT BEDTIME. 90 capsule 2  . methocarbamol (ROBAXIN) 500 MG tablet Take 1 tablet (500 mg total) by mouth 4 (four) times daily. 60 tablet 1  . nabumetone (RELAFEN) 750 MG tablet Take 750 mg by mouth daily as needed for moderate pain.     Marland Kitchen pyridOXINE (VITAMIN B-6) 100 MG tablet Take 1 tablet (100 mg total) by mouth 2 (two) times daily. 60 tablet 1  . rOPINIRole (REQUIP) 0.5 MG tablet Take 0.5 mg by mouth at bedtime as needed (restless legs).     . sitaGLIPtin (JANUVIA) 50 MG tablet Take 1 tablet (50 mg total) by mouth daily. 30 tablet 2  . tamsulosin (FLOMAX) 0.4 MG CAPS capsule Take 1 capsule (0.4 mg total) daily by mouth. 14 capsule 0   No facility-administered medications prior to visit.     Allergies  Allergen Reactions  . Morphine And Related Nausea And Vomiting    Patient prefers not to take to take    Review of Systems  Constitutional: Negative for chills and fever.  HENT: Positive for congestion and sinus pain. Negative for ear discharge, hearing loss and sore throat.   Respiratory: Positive for cough and sputum production. Negative for shortness of breath and wheezing.   Cardiovascular: Negative for chest pain.  Gastrointestinal: Negative.   Genitourinary:       Elevated Cr Has kidney stones  Neurological: Positive for headaches.  Endo/Heme/Allergies: Positive for environmental allergies.  Psychiatric/Behavioral: Negative.        Objective:    Physical Exam  Constitutional: He is oriented to person, place,  and time. He appears well-developed and well-nourished. No distress.  HENT:  Head: Normocephalic and atraumatic.  Right Ear: External ear normal.  Left Ear: External ear normal.  Nose: Mucosal edema present.  Mouth/Throat: Oropharynx is clear and moist. No oropharyngeal exudate or tonsillar abscesses.  Neck: Neck supple.  Cardiovascular: Normal rate, regular rhythm and normal heart sounds.  Pulmonary/Chest: Effort normal and breath sounds normal.  Lymphadenopathy:    He has cervical adenopathy.  Neurological: He is alert and oriented to person, place,  and time.  Psychiatric: He has a normal mood and affect.    BP 120/83   Pulse 98   Temp 98.8 F (37.1 C) (Oral)   Resp 15   Ht 5\' 6"  (1.676 m)   Wt 236 lb 3.2 oz (107.1 kg)   SpO2 96%   BMI 38.12 kg/m  Wt Readings from Last 3 Encounters:  07/01/19 236 lb 3.2 oz (107.1 kg)  05/02/19 230 lb 9.6 oz (104.6 kg)  04/10/19 227 lb (103 kg)    Health Maintenance Due  Topic Date Due  . Hepatitis C Screening  [birth year]  . PNEUMOCOCCAL POLYSACCHARIDE VACCINE AGE 15-64 HIGH RISK  09/08/1964  . FOOT EXAM  09/08/1972  . OPHTHALMOLOGY EXAM  09/08/1972  . HIV Screening  09/08/1977  . TETANUS/TDAP  09/08/1981  . INFLUENZA VACCINE  03/16/2019    Lab Results  Component Value Date   TSH 3.09 09/28/2018   Lab Results  Component Value Date   WBC 8.4 09/28/2018   HGB 16.3 09/28/2018   HCT 47 09/28/2018   MCV 87.4 07/02/2017   PLT 197 09/28/2018   Lab Results  Component Value Date   NA 140 02/14/2019   K 4.4 02/14/2019   CO2 27 07/02/2017   GLUCOSE 121 (H) 07/02/2017   BUN 18 02/14/2019   CREATININE 1.6 (A) 02/14/2019   ALKPHOS 61 09/28/2018   AST 9 (A) 09/28/2018   ALT 14 09/28/2018   ALBUMIN 4.8 09/28/2018   CALCIUM 9.7 09/28/2018   ANIONGAP 8 07/02/2017   Lab Results  Component Value Date   CHOL 201 (A) 09/28/2018   Lab Results  Component Value Date   HDL 54 09/28/2018   Lab Results  Component Value Date    LDLCALC 123 09/28/2018   Lab Results  Component Value Date   TRIG 122 09/28/2018   No results found for: [facility] Lab Results  Component Value Date   HGBA1C 6.5 02/14/2019       Assessment & Plan:  1. Acute maxillary sinusitis, recurrence not specified mucinex Augmentin-rx [place] 2. Type 2 diabetes mellitus with chronic kidney disease, without long-term current use of insulin, unspecified CKD stage ([facility]) - Ambulatory referral to Nephrology Changed from Metformin to Januvia due to elevated Cr-pt with h/o kidney stones intermittently-last one passed without intervention.  3. Abnormal laboratory test Elevated Cr-concern by pt for change in medication from metformin - Ambulatory referral to Nephrology LISA 04/17/2019, MD

## 2019-08-01 ENCOUNTER — Telehealth (INDEPENDENT_AMBULATORY_CARE_PROVIDER_SITE_OTHER): Payer: Self-pay

## 2019-08-01 ENCOUNTER — Telehealth: Payer: Self-pay | Admitting: Family Medicine

## 2019-08-01 DIAGNOSIS — N1831 Chronic kidney disease, stage 3a: Secondary | ICD-10-CM

## 2019-08-01 NOTE — Telephone Encounter (Signed)
Jesus Ingram, CMA  

## 2019-08-01 NOTE — Telephone Encounter (Signed)
Informed patient

## 2019-08-01 NOTE — Telephone Encounter (Signed)
Done

## 2019-08-01 NOTE — Telephone Encounter (Signed)
Patient is calling and needs his lab orders from 9/17 switched to [facility].

## 2019-08-02 ENCOUNTER — Other Ambulatory Visit: Payer: Self-pay | Admitting: Family Medicine

## 2019-08-02 NOTE — Telephone Encounter (Signed)
Requested medication (s) are due for refill today: yes  Requested medication (s) are on the active medication list: yes  Last refill:  05/02/2019  Future visit scheduled:yes  Notes to clinic:  review for refill   Requested Prescriptions  Pending Prescriptions Disp Refills   JANUVIA 50 MG tablet [Pharmacy Med Name: JANUVIA 50 MG TABLET] 90 tablet 0    Sig: TAKE 1 TABLET BY MOUTH EVERY DAY      There is no refill protocol information for this order

## 2019-08-03 LAB — HEMOGLOBIN A1C
Est. average glucose Bld gHb Est-mCnc: 137 mg/dL
Hgb A1c MFr Bld: 6.4 % — ABNORMAL HIGH (ref 4.8–5.6)

## 2019-08-03 LAB — COMPREHENSIVE METABOLIC PANEL
ALT: 12 IU/L (ref 0–44)
AST: 12 IU/L (ref 0–40)
Albumin/Globulin Ratio: 1.8 (ref 1.2–2.2)
Albumin: 4.6 g/dL (ref 3.8–4.9)
Alkaline Phosphatase: 63 IU/L (ref 39–117)
BUN/Creatinine Ratio: 10 (ref 9–20)
BUN: 13 mg/dL (ref 6–24)
Bilirubin Total: 0.6 mg/dL (ref 0.0–1.2)
CO2: 25 mmol/L (ref 20–29)
Calcium: 9.8 mg/dL (ref 8.7–10.2)
Chloride: 98 mmol/L (ref 96–106)
Creatinine, Ser: 1.28 mg/dL — ABNORMAL HIGH (ref 0.76–1.27)
GFR calc Af Amer: 72 mL/min/{1.73_m2} (ref >59)
GFR calc non Af Amer: 62 mL/min/{1.73_m2} (ref >59)
Globulin, Total: 2.6 g/dL (ref 1.5–4.5)
Glucose: 110 mg/dL — ABNORMAL HIGH (ref 65–99)
Potassium: 4.6 mmol/L (ref 3.5–5.2)
Sodium: 139 mmol/L (ref 134–144)
Total Protein: 7.2 g/dL (ref 6.0–8.5)

## 2019-08-03 LAB — LIPID PANEL
Chol/HDL Ratio: 4.4 ratio (ref 0.0–5.0)
Cholesterol, Total: 177 mg/dL (ref 100–199)
HDL: 40 mg/dL (ref >39)
LDL Chol Calc (NIH): 103 mg/dL — ABNORMAL HIGH (ref 0–99)
Triglycerides: 193 mg/dL — ABNORMAL HIGH (ref 0–149)
VLDL Cholesterol Cal: 34 mg/dL (ref 5–40)

## 2019-08-12 ENCOUNTER — Other Ambulatory Visit: Payer: Self-pay

## 2019-08-12 ENCOUNTER — Telehealth (INDEPENDENT_AMBULATORY_CARE_PROVIDER_SITE_OTHER): Payer: 59 | Admitting: Family Medicine

## 2019-08-12 VITALS — BP 120/83 | HR 98 | Temp 98.8°F | Ht 66.0 in | Wt 236.0 lb

## 2019-08-12 DIAGNOSIS — Z7984 Long term (current) use of oral hypoglycemic drugs: Secondary | ICD-10-CM

## 2019-08-12 DIAGNOSIS — N189 Chronic kidney disease, unspecified: Secondary | ICD-10-CM

## 2019-08-12 DIAGNOSIS — E7849 Other hyperlipidemia: Secondary | ICD-10-CM | POA: Diagnosis not present

## 2019-08-12 DIAGNOSIS — K219 Gastro-esophageal reflux disease without esophagitis: Secondary | ICD-10-CM | POA: Diagnosis not present

## 2019-08-12 DIAGNOSIS — E1122 Type 2 diabetes mellitus with diabetic chronic kidney disease: Secondary | ICD-10-CM

## 2019-08-12 NOTE — Progress Notes (Signed)
Virtual Visit via Telephone Note  I connected with Jesus Ingram on 08/12/19 at  1:00 PM EST by telephone and verified that I am speaking with the correct person using two identifiers.DOB/address  Location: Patient: home Provider:clinic   I discussed the limitations, risks, security and privacy concerns of performing an evaluation and management service by telephone and the availability of in person appointments. I also discussed with the patient that there may be a patient responsible charge related to this service. The patient expressed understanding and agreed to proceed.   History of Present Illness: DM-weight loss and diet changes-Januvia-started due to elevated Cr-pt states expensive. Previously off all medication with weight loss then with weight gain pt had to restart metformin-now Januvia. Pt states she tried to quit chewing tobacco with weight gain  Elevated renal function-improved with fluids, decrease NSAIDs    Observations/Objective: labwork reviewed No regular glucose readings  Assessment and Plan: 1. Type 2 diabetes mellitus with chronic kidney disease, without long-term current use of insulin, unspecified CKD stage ([place]) Re-start metformin-renal function has improved-pt states he can not afford to take Januvia once he retires. Pt with recheck labwork in 3 months on Metformin 500mg  daily  2. Gastroesophageal reflux disease without esophagitis controlled  3. Other hyperlipidemia Diet modification-pt admits to eating poorly due to work schedule-will try to avoid fried and fast foods  Follow Up Instructions: 3 months-fasting labwork-renal function, lipid panel-goal LDL 70   I discussed the assessment and treatment plan with the patient. The patient was provided an opportunity to ask questions and all were answered. The patient agreed with the plan and demonstrated an understanding of the instructions.   The patient was advised to call back or seek an in-person  evaluation if the symptoms worsen or if the condition fails to improve as anticipated.  I provided 12 minutes of non-face-to-face time during this encounter.   Rochanda Harpham Hannah Beat, MD

## 2019-08-12 NOTE — Patient Instructions (Signed)
3 month fasting labwork-virtual or in office visit

## 2019-09-03 LAB — HM DIABETES EYE EXAM

## 2019-10-28 ENCOUNTER — Other Ambulatory Visit: Payer: Self-pay | Admitting: Nephrology

## 2019-10-28 ENCOUNTER — Other Ambulatory Visit (HOSPITAL_COMMUNITY): Payer: Self-pay | Admitting: Nephrology

## 2019-10-28 DIAGNOSIS — R809 Proteinuria, unspecified: Secondary | ICD-10-CM

## 2019-10-28 DIAGNOSIS — N182 Chronic kidney disease, stage 2 (mild): Secondary | ICD-10-CM

## 2019-10-29 ENCOUNTER — Telehealth: Payer: Self-pay | Admitting: Emergency Medicine

## 2019-10-29 LAB — COMPREHENSIVE METABOLIC PANEL
ALT: 9 IU/L (ref 0–44)
AST: 13 IU/L (ref 0–40)
Albumin/Globulin Ratio: 1.8 (ref 1.2–2.2)
Albumin: 4.8 g/dL (ref 3.8–4.9)
Alkaline Phosphatase: 61 IU/L (ref 39–117)
BUN/Creatinine Ratio: 11 (ref 9–20)
BUN: 14 mg/dL (ref 6–24)
Bilirubin Total: 0.3 mg/dL (ref 0.0–1.2)
CO2: 22 mmol/L (ref 20–29)
Calcium: 10.3 mg/dL — ABNORMAL HIGH (ref 8.7–10.2)
Chloride: 102 mmol/L (ref 96–106)
Creatinine, Ser: 1.26 mg/dL (ref 0.76–1.27)
GFR calc Af Amer: 73 mL/min/{1.73_m2} (ref >59)
GFR calc non Af Amer: 63 mL/min/{1.73_m2} (ref >59)
Globulin, Total: 2.7 g/dL (ref 1.5–4.5)
Glucose: 98 mg/dL (ref 65–99)
Potassium: 4.9 mmol/L (ref 3.5–5.2)
Sodium: 143 mmol/L (ref 134–144)
Total Protein: 7.5 g/dL (ref 6.0–8.5)

## 2019-10-29 LAB — LIPID PANEL W/O CHOL/HDL RATIO
Cholesterol, Total: 186 mg/dL (ref 100–199)
HDL: 43 mg/dL (ref >39)
LDL Chol Calc (NIH): 114 mg/dL — ABNORMAL HIGH (ref 0–99)
Triglycerides: 164 mg/dL — ABNORMAL HIGH (ref 0–149)
VLDL Cholesterol Cal: 29 mg/dL (ref 5–40)

## 2019-10-29 LAB — HGB A1C W/O EAG: Hgb A1c MFr Bld: 6.9 % — ABNORMAL HIGH (ref 4.8–5.6)

## 2019-10-29 LAB — SPECIMEN STATUS REPORT

## 2019-10-29 NOTE — Telephone Encounter (Signed)
-----   Message from Wandra Feinstein, MD sent at 10/29/2019  8:43 AM EDT ----- Your triglycerides have improved , your renal function has stabilized. Continue taking the Januvia and we will monitor closely. You need to consider taking medication for cholesterol elevation. We can discuss at your appointment.

## 2019-10-31 ENCOUNTER — Other Ambulatory Visit: Payer: Self-pay

## 2019-10-31 ENCOUNTER — Ambulatory Visit (HOSPITAL_COMMUNITY)
Admission: RE | Admit: 2019-10-31 | Discharge: 2019-10-31 | Disposition: A | Discharge status: Home / Self Care | Payer: 59 | Source: Ambulatory Visit | Attending: Nephrology | Admitting: Nephrology

## 2019-10-31 DIAGNOSIS — N182 Chronic kidney disease, stage 2 (mild): Secondary | ICD-10-CM | POA: Insufficient documentation

## 2019-10-31 DIAGNOSIS — R809 Proteinuria, unspecified: Secondary | ICD-10-CM | POA: Diagnosis present

## 2019-11-11 ENCOUNTER — Ambulatory Visit: Payer: 59 | Admitting: Family Medicine

## 2019-11-11 ENCOUNTER — Other Ambulatory Visit: Payer: Self-pay

## 2019-11-11 ENCOUNTER — Encounter: Payer: Self-pay | Admitting: Family Medicine

## 2019-11-11 VITALS — BP 130/81 | HR 77 | Temp 97.6°F | Ht 72.0 in | Wt 239.6 lb

## 2019-11-11 DIAGNOSIS — E1122 Type 2 diabetes mellitus with diabetic chronic kidney disease: Secondary | ICD-10-CM

## 2019-11-11 DIAGNOSIS — K219 Gastro-esophageal reflux disease without esophagitis: Secondary | ICD-10-CM | POA: Diagnosis not present

## 2019-11-11 DIAGNOSIS — E7849 Other hyperlipidemia: Secondary | ICD-10-CM

## 2019-11-11 DIAGNOSIS — J302 Other seasonal allergic rhinitis: Secondary | ICD-10-CM

## 2019-11-11 MED ORDER — METFORMIN HCL 500 MG PO TABS: 500.0000 mg | ORAL_TABLET | Freq: Two times a day (BID) | ORAL | 1 refills | Status: AC

## 2019-11-11 NOTE — Patient Instructions (Addendum)
   If you have lab work done today you will be contacted with your lab results within the next 2 weeks.  If you have not heard from us then please contact us. The fastest way to get your results is to register for My Chart.   IF you received an x-ray today, you will receive an invoice from [facility]. Please contact [facility] at [phone] with questions or concerns regarding your invoice.   IF you received labwork today, you will receive an invoice from [facility]. Please contact [facility] at [phone] with questions or concerns regarding your invoice.   Our billing staff will not be able to assist you with questions regarding bills from these companies.  You will be contacted with the lab results as soon as they are available. The fastest way to get your results is to activate your My Chart account. Instructions are located on the last page of this paperwork. If you have not heard from us regarding the results in 2 weeks, please contact this office.      Diabetes Mellitus and Nutrition, Adult When you have diabetes (diabetes mellitus), it is very important to have healthy eating habits because your blood sugar (glucose) levels are greatly affected by what you eat and drink. Eating healthy foods in the appropriate amounts, at about the same times every day, can help you:  Control your blood glucose.  Lower your risk of heart disease.  Improve your blood pressure.  Reach or maintain a healthy weight. Every person with diabetes is different, and each person has different needs for a meal plan. Your health care provider may recommend that you work with a diet and nutrition specialist (dietitian) to make a meal plan that is best for you. Your meal plan may vary depending on factors such as:  The calories you need.  The medicines you take.  Your weight.  Your blood glucose, blood pressure, and cholesterol levels.  Your activity level.  Other health  conditions you have, such as heart or kidney disease. How do carbohydrates affect me? Carbohydrates, also called carbs, affect your blood glucose level more than any other type of food. Eating carbs naturally raises the amount of glucose in your blood. Carb counting is a method for keeping track of how many carbs you eat. Counting carbs is important to keep your blood glucose at a healthy level, especially if you use insulin or take certain oral diabetes medicines. It is important to know how many carbs you can safely have in each meal. This is different for every person. Your dietitian can help you calculate how many carbs you should have at each meal and for each snack. Foods that contain carbs include:  Bread, cereal, rice, pasta, and crackers.  Potatoes and corn.  Peas, beans, and lentils.  Milk and yogurt.  Fruit and juice.  Desserts, such as cakes, cookies, ice cream, and candy. How does alcohol affect me? Alcohol can cause a sudden decrease in blood glucose (hypoglycemia), especially if you use insulin or take certain oral diabetes medicines. Hypoglycemia can be a life-threatening condition. Symptoms of hypoglycemia (sleepiness, dizziness, and confusion) are similar to symptoms of having too much alcohol. If your health care provider says that alcohol is safe for you, follow these guidelines:  Limit alcohol intake to no more than 1 drink per day for nonpregnant women and 2 drinks per day for men. One drink equals 12 oz of beer, 5 oz of wine, or 1 oz of hard   liquor.  Do not drink on an empty stomach.  Keep yourself hydrated with water, diet soda, or unsweetened iced tea.  Keep in mind that regular soda, juice, and other mixers may contain a lot of sugar and must be counted as carbs. What are tips for following this plan?  Reading food labels  Start by checking the serving size on the "Nutrition Facts" label of packaged foods and drinks. The amount of calories, carbs, fats, and  other nutrients listed on the label is based on one serving of the item. Many items contain more than one serving per package.  Check the total grams (g) of carbs in one serving. You can calculate the number of servings of carbs in one serving by dividing the total carbs by 15. For example, if a food has 30 g of total carbs, it would be equal to 2 servings of carbs.  Check the number of grams (g) of saturated and trans fats in one serving. Choose foods that have low or no amount of these fats.  Check the number of milligrams (mg) of salt (sodium) in one serving. Most people should limit total sodium intake to less than 2,300 mg per day.  Always check the nutrition information of foods labeled as "low-fat" or "nonfat". These foods may be higher in added sugar or refined carbs and should be avoided.  Talk to your dietitian to identify your daily goals for nutrients listed on the label. Shopping  Avoid buying canned, premade, or processed foods. These foods tend to be high in fat, sodium, and added sugar.  Shop around the outside edge of the grocery store. This includes fresh fruits and vegetables, bulk grains, fresh meats, and fresh dairy. Cooking  Use low-heat cooking methods, such as baking, instead of high-heat cooking methods like deep frying.  Cook using healthy oils, such as olive, canola, or sunflower oil.  Avoid cooking with butter, cream, or high-fat meats. Meal planning  Eat meals and snacks regularly, preferably at the same times every day. Avoid going long periods of time without eating.  Eat foods high in fiber, such as fresh fruits, vegetables, beans, and whole grains. Talk to your dietitian about how many servings of carbs you can eat at each meal.  Eat 4-6 ounces (oz) of lean protein each day, such as lean meat, chicken, fish, eggs, or tofu. One oz of lean protein is equal to: ? 1 oz of meat, chicken, or fish. ? 1 egg. ?  cup of tofu.  Eat some foods each day that  contain healthy fats, such as avocado, nuts, seeds, and fish. Lifestyle  Check your blood glucose regularly.  Exercise regularly as told by your health care provider. This may include: ? 150 minutes of moderate-intensity or vigorous-intensity exercise each week. This could be brisk walking, biking, or water aerobics. ? Stretching and doing strength exercises, such as yoga or weightlifting, at least 2 times a week.  Take medicines as told by your health care provider.  Do not use any products that contain nicotine or tobacco, such as cigarettes and e-cigarettes. If you need help quitting, ask your health care provider.  Work with a counselor or diabetes educator to identify strategies to manage stress and any emotional and social challenges. Questions to ask a health care provider  Do I need to meet with a diabetes educator?  Do I need to meet with a dietitian?  What number can I call if I have questions?  When are the best   times to check my blood glucose? Where to find more information:  American Diabetes Association: diabetes.org  Academy of Nutrition and Dietetics: www.eatright.org  National Institute of Diabetes and Digestive and Kidney Diseases (NIH): www.niddk.nih.gov Summary  A healthy meal plan will help you control your blood glucose and maintain a healthy lifestyle.  Working with a diet and nutrition specialist (dietitian) can help you make a meal plan that is best for you.  Keep in mind that carbohydrates (carbs) and alcohol have immediate effects on your blood glucose levels. It is important to count carbs and to use alcohol carefully. This information is not intended to replace advice given to you by your health care provider. Make sure you discuss any questions you have with your health care provider. Document Revised: 07/14/2017 Document Reviewed: 09/05/2016 Elsevier Patient Education  2020 Elsevier Inc.  

## 2019-11-11 NOTE — Progress Notes (Signed)
Established Patient Office Visit  Subjective:  Patient ID: Jesus Ingram, male    DOB: 20-Sep-1960  Age: 59 y.o. MRN: [id number]  CC:  Chief Complaint  Patient presents with  . Follow-up    3 month f/u diabetes    HPI Jesus Ingram presents for DM -pt gets home from work at [employer] bed at 2am and up at 8am. Pt usually has breakfast 2xweek, lunch-larger 1pm and dinner at work-5pm, supper at 7pm. Pt has been taking metformin at bedtime. Pt states working seconds causes odd eating habits. Plantar fascitis -right foot-specialized shoes. Steel toe at work  Past Medical History:  Diagnosis Date  . Arthritis    denies  . Borderline diabetes   . Chronic back pain   . Diabetes mellitus without complication (HCC)   . GERD (gastroesophageal reflux disease)   . History of kidney stones    history of kidney stones.  . History of renal calculi   . Reflux    occasional, uses TUMS  . Restless leg syndrome   . Ruptured disc, thoracic   . Seasonal allergies   . Sleep apnea    lost weight and has gotten better    Past Surgical History:  Procedure Laterality Date  . CATARACT EXTRACTION W/PHACO  01/10/2012   Procedure: CATARACT EXTRACTION PHACO AND INTRAOCULAR LENS PLACEMENT (IOC);  Surgeon: Loraine Leriche T. Nile Riggs, MD;  Location: [facility];  Service: Ophthalmology;  Laterality: Right;  CDE 9.77  . CATARACT EXTRACTION W/PHACO Left 01/13/2015   Procedure: CATARACT EXTRACTION PHACO AND INTRAOCULAR LENS PLACEMENT (IOC);  Surgeon: Jethro Bolus, MD;  Location: [facility];  Service: Ophthalmology;  Laterality: Left;  CDE:3.08  . COLONOSCOPY N/A 04/25/2013   Procedure: COLONOSCOPY;  Surgeon: Malissa Hippo, MD;  Location: [facility];  Service: Endoscopy;  Laterality: N/A;  830  . CYSTOSCOPY/RETROGRADE/URETEROSCOPY/STONE EXTRACTION WITH BASKET  2011   with stents, [facility]; [facility]  . ELBOW ARTHROSCOPY W/ SYNOVECTOMY    . LITHOTRIPSY    . SHOULDER ARTHROSCOPY WITH ROTATOR CUFF REPAIR Right 11/03/2016    Procedure: SHOULDER ARTHROSCOPY WITH ROTATOR CUFF REPAIR;  Surgeon: Vickki Hearing, MD;  Location: [facility];  Service: Orthopedics;  Laterality: Right;  . ULNAR TUNNEL RELEASE Right 07/14/2016   Procedure: CUBITAL TUNNEL RELEASE;  Surgeon: Vickki Hearing, MD;  Location: [facility];  Service: Orthopedics;  Laterality: Right;  . WISDOM TOOTH EXTRACTION      Family History  Problem Relation Age of Onset  . Diabetes Father   . Diabetes Maternal Grandmother   . Colon cancer Neg Hx     Social History   Socioeconomic History  . Marital status: Married    Spouse name: Not on file  . Number of children: Not on file  . Years of education: Not on file  . Highest education level: Not on file  Occupational History  . Occupation: Curator   Tobacco Use  . Smoking status: Never Smoker  . Smokeless tobacco: Current User    Types: Chew  Substance and Sexual Activity  . Alcohol use: No  . Drug use: No  . Sexual activity: Yes    Birth control/protection: None  Other Topics Concern  . Not on file  Social History Narrative  . Not on file   Social Determinants of Health   Financial Resource Strain:   . Difficulty of Paying Living Expenses:   Food Insecurity:   . Worried About Programme researcher, broadcasting/film/video in the Last Year:   .  Ran Out of Food in the Last Year:   Transportation Needs:   . Film/video editor (Medical):   Marland Kitchen Lack of Transportation (Non-Medical):   Physical Activity:   . Days of Exercise per Week:   . Minutes of Exercise per Session:   Stress:   . Feeling of Stress :   Social Connections:   . Frequency of Communication with Friends and Family:   . Frequency of Social Gatherings with Friends and Family:   . Attends Religious Services:   . Active Member of Clubs or Organizations:   . Attends Archivist Meetings:   Marland Kitchen Marital Status:   Intimate Partner Violence:   . Fear of Current or Ex-Partner:   . Emotionally Abused:   Marland Kitchen Physically Abused:   . Sexually  Abused:     Outpatient Medications Prior to Visit  Medication Sig Dispense Refill  . fluticasone (FLONASE) 50 MCG/ACT nasal spray Place into both nostrils daily.    . metFORMIN (GLUCOPHAGE) 500 MG tablet Take by mouth 2 (two) times daily with a meal.    . amoxicillin-clavulanate (AUGMENTIN) 875-125 MG tablet Take 1 tablet by mouth 2 (two) times daily. 20 tablet 0  . calcium carbonate (TUMS - DOSED IN MG ELEMENTAL CALCIUM) 500 MG chewable tablet Chew 2 tablets by mouth daily as needed for indigestion or heartburn.     . gabapentin (NEURONTIN) 100 MG capsule TAKE 1 CAPSULE BY MOUTH AT BEDTIME. 90 capsule 2  . JANUVIA 50 MG tablet TAKE 1 TABLET BY MOUTH EVERY DAY 90 tablet 0  . methocarbamol (ROBAXIN) 500 MG tablet Take 1 tablet (500 mg total) by mouth 4 (four) times daily. 60 tablet 1  . nabumetone (RELAFEN) 750 MG tablet Take 750 mg by mouth daily as needed for moderate pain.     Marland Kitchen pyridOXINE (VITAMIN B-6) 100 MG tablet Take 1 tablet (100 mg total) by mouth 2 (two) times daily. 60 tablet 1  . rOPINIRole (REQUIP) 0.5 MG tablet Take 0.5 mg by mouth at bedtime as needed (restless legs).     . tamsulosin (FLOMAX) 0.4 MG CAPS capsule Take 1 capsule (0.4 mg total) daily by mouth. 14 capsule 0   No facility-administered medications prior to visit.    Allergies  Allergen Reactions  . Morphine And Related Nausea And Vomiting    Patient prefers not to take to take    ROS Review of Systems  HENT: Positive for congestion.   Eyes:       Diabetic eye exam Glaucoma test normal Cataract surgery bilat  Respiratory: Negative.   Cardiovascular: Negative.   Endocrine:       DM  Genitourinary: Negative.   Musculoskeletal: Positive for arthralgias.       Foot pain Knee pain-lateral Rotator cuff-injections Plantar fascitis-right foot  Skin: Negative.   Allergic/Immunologic: Positive for environmental allergies.  Neurological: Negative.   Hematological: Negative.   Psychiatric/Behavioral:  Negative.       Objective:    Physical Exam  Constitutional: He is oriented to person, place, and time. He appears well-developed and well-nourished.  HENT:  Head: Normocephalic and atraumatic.  Eyes: Conjunctivae are normal.  Cardiovascular: Normal rate, regular rhythm and normal heart sounds.  Pulmonary/Chest: Effort normal and breath sounds normal.  Neurological: He is oriented to person, place, and time.  Psychiatric: He has a normal mood and affect. His behavior is normal.    BP 130/81 (BP Location: Right Arm, Patient Position: Sitting, Cuff Size: Normal)   Pulse 77  Temp 97.6 F (36.4 C) (Temporal)   Ht 6' (1.829 m)   Wt 239 lb 9.6 oz (108.7 kg)   SpO2 96%   BMI 32.50 kg/m  Wt Readings from Last 3 Encounters:  11/11/19 239 lb 9.6 oz (108.7 kg)  08/12/19 236 lb (107 kg)  07/01/19 236 lb 3.2 oz (107.1 kg)    Health Maintenance Due  Topic Date Due  . Hepatitis C Screening  Never done  . PNEUMOCOCCAL POLYSACCHARIDE VACCINE AGE 71-64 HIGH RISK  Never done  . FOOT EXAM  Never done  . HIV Screening  Never done  . TETANUS/TDAP  Never done  . INFLUENZA VACCINE  Never done    Lab Results  Component Value Date   TSH 3.09 09/28/2018   Lab Results  Component Value Date   WBC 8.4 09/28/2018   HGB 16.3 09/28/2018   HCT 47 09/28/2018   MCV 87.4 07/02/2017   PLT 197 09/28/2018   Lab Results  Component Value Date   NA 143 10/28/2019   K 4.9 10/28/2019   CO2 22 10/28/2019   GLUCOSE 98 10/28/2019   BUN 14 10/28/2019   CREATININE 1.26 10/28/2019   BILITOT 0.3 10/28/2019   ALKPHOS 61 10/28/2019   AST 13 10/28/2019   ALT 9 10/28/2019   PROT 7.5 10/28/2019   ALBUMIN 4.8 10/28/2019   CALCIUM 10.3 (H) 10/28/2019   ANIONGAP 8 07/02/2017   Lab Results  Component Value Date   CHOL 186 10/28/2019   Lab Results  Component Value Date   HDL 43 10/28/2019   Lab Results  Component Value Date   LDLCALC 114 (H) 10/28/2019   Lab Results  Component Value Date    TRIG 164 (H) 10/28/2019   Lab Results  Component Value Date   CHOLHDL 4.4 08/02/2019   Lab Results  Component Value Date   HGBA1C 6.9 (H) 10/28/2019      Assessment & Plan:   1. Type 2 diabetes mellitus with chronic kidney disease, without long-term current use of insulin, unspecified CKD stage (HCC) Encouraged pt to increase metformin to BID-take with food-renal function stable Foot exam normal-tenderness -plantar fascitis -right Eye exam normal Renal function 1.26-stable-saw nephro-yearly follow up A1c 6.9% Suggested COVID vaccine-pt declines 2. Seasonal allergic rhinitis, unspecified trigger flonase Prn antihistamine 3. Other hyperlipidemia  no medication currently-diet modificaiton 4. Gastroesophageal reflux disease without esophagitis Diet modification Follow-up:  DM-6 months follow up  Jesus Gottschall Mat Carne, MD

## 2019-12-06 ENCOUNTER — Ambulatory Visit: Payer: 59

## 2019-12-06 ENCOUNTER — Ambulatory Visit (INDEPENDENT_AMBULATORY_CARE_PROVIDER_SITE_OTHER): Payer: 59 | Admitting: Orthopedic Surgery

## 2019-12-06 ENCOUNTER — Encounter: Payer: Self-pay | Admitting: Orthopedic Surgery

## 2019-12-06 ENCOUNTER — Other Ambulatory Visit: Payer: Self-pay

## 2019-12-06 VITALS — BP 125/75 | HR 93 | Ht 72.0 in | Wt 230.0 lb

## 2019-12-06 DIAGNOSIS — M25561 Pain in right knee: Secondary | ICD-10-CM

## 2019-12-06 DIAGNOSIS — G8929 Other chronic pain: Secondary | ICD-10-CM

## 2019-12-06 DIAGNOSIS — M25512 Pain in left shoulder: Secondary | ICD-10-CM | POA: Diagnosis not present

## 2019-12-06 NOTE — Patient Instructions (Signed)
Tendonitis left shoulder   Arthritis right  Knee    We can watch these right now, no surgery needed   Watch for catching locking giving way

## 2019-12-06 NOTE — Progress Notes (Signed)
NEW PROBLEM//OFFICE VISIT  Chief Complaint  Patient presents with  . Knee Pain    right   . Shoulder Pain    left     59 year old male presents with a 1 month history of burning pain lateral side right knee with no catching locking or giving way  He also presents with acute pain left shoulder x1 day no trauma   Review of Systems  All other systems reviewed and are negative.    Past Medical History:  Diagnosis Date  . Arthritis    denies  . Borderline diabetes   . Chronic back pain   . Diabetes mellitus without complication (HCC)   . GERD (gastroesophageal reflux disease)   . History of kidney stones    history of kidney stones.  . History of renal calculi   . Reflux    occasional, uses TUMS  . Restless leg syndrome   . Ruptured disc, thoracic   . Seasonal allergies   . Sleep apnea    lost weight and has gotten better    Past Surgical History:  Procedure Laterality Date  . CATARACT EXTRACTION W/PHACO  01/10/2012   Procedure: CATARACT EXTRACTION PHACO AND INTRAOCULAR LENS PLACEMENT (IOC);  Surgeon: Loraine Leriche T. Nile Riggs, MD;  Location: [facility];  Service: Ophthalmology;  Laterality: Right;  CDE 9.77  . CATARACT EXTRACTION W/PHACO Left 01/13/2015   Procedure: CATARACT EXTRACTION PHACO AND INTRAOCULAR LENS PLACEMENT (IOC);  Surgeon: Jethro Bolus, MD;  Location: [facility];  Service: Ophthalmology;  Laterality: Left;  CDE:3.08  . COLONOSCOPY N/A 04/25/2013   Procedure: COLONOSCOPY;  Surgeon: Malissa Hippo, MD;  [facility];  Service: Endoscopy;  Laterality: N/A;  830  . CYSTOSCOPY/RETROGRADE/URETEROSCOPY/STONE EXTRACTION WITH BASKET  2011   with stents, [facility]; [facility]  . ELBOW ARTHROSCOPY W/ SYNOVECTOMY    . LITHOTRIPSY    . SHOULDER ARTHROSCOPY WITH ROTATOR CUFF REPAIR Right 11/03/2016   Procedure: SHOULDER ARTHROSCOPY WITH ROTATOR CUFF REPAIR;  Surgeon: Vickki Hearing, MD;  Location: [facility];  Service: Orthopedics;  Laterality: Right;  . ULNAR TUNNEL RELEASE Right  07/14/2016   Procedure: CUBITAL TUNNEL RELEASE;  Surgeon: Vickki Hearing, MD;  Location: [facility];  Service: Orthopedics;  Laterality: Right;  . WISDOM TOOTH EXTRACTION      Family History  Problem Relation Age of Onset  . Diabetes Father   . Diabetes Maternal Grandmother   . Colon cancer Neg Hx    Social History   Tobacco Use  . Smoking status: Never Smoker  . Smokeless tobacco: Current User    Types: Chew  Substance Use Topics  . Alcohol use: No  . Drug use: No    Allergies  Allergen Reactions  . Morphine And Related Nausea And Vomiting    Patient prefers not to take to take    Current Meds  Medication Sig  . fluticasone (FLONASE) 50 MCG/ACT nasal spray Place into both nostrils daily.  . metFORMIN (GLUCOPHAGE) 500 MG tablet Take 1 tablet (500 mg total) by mouth 2 (two) times daily with a meal.    BP 125/75   Pulse 93   Ht 6' (1.829 m)   Wt 230 lb (104.3 kg)   BMI 31.19 kg/m   Physical Exam Vitals and nursing note reviewed.  Constitutional:      Appearance: Normal appearance.  Neurological:     Mental Status: He is alert and oriented to person, place, and time.  Psychiatric:        Mood and  Affect: Mood normal.     Ortho Exam  Left shoulder greater tuberosity with normal range of motion normal strength no instability negative impingement sign and grade 5 strength in his cuff  Right knee no detectable effusion full range of motion stable knee normal muscle tone negative McMurray's mild pain posterior lateral corner and he says that is where it is burning   MEDICAL DECISION MAKING  A.  Encounter Diagnoses  Name Primary?  . Acute pain of left shoulder Yes  . Chronic pain of right knee     B. DATA ANALYSED:    IMAGING: Independent interpretation of images: Left shoulder was normal right knee showed small effusion with symmetric joint space narrowing consistent with mild arthritis  Orders: no  Outside records reviewed: no  C. MANAGEMENT  expectant management I told him to watch for catching locking giving way of his knee and any problems or increased pain or stiffness in the shoulder  No orders of the defined types were placed in this encounter.  1 chronic problem with exacerbation 1 acute uncomplicated illness   Arther Abbott, MD  12/06/2019 10:47 AM

## 2020-05-03 ENCOUNTER — Other Ambulatory Visit: Payer: Self-pay | Admitting: Family Medicine

## 2021-01-25 ENCOUNTER — Other Ambulatory Visit: Payer: Self-pay

## 2021-01-25 ENCOUNTER — Ambulatory Visit (INDEPENDENT_AMBULATORY_CARE_PROVIDER_SITE_OTHER): Payer: Self-pay | Admitting: Orthopedic Surgery

## 2021-01-25 ENCOUNTER — Encounter: Payer: Self-pay | Admitting: Orthopedic Surgery

## 2021-01-25 VITALS — BP 139/83 | HR 89 | Ht 72.0 in | Wt 229.0 lb

## 2021-01-25 DIAGNOSIS — M7542 Impingement syndrome of left shoulder: Secondary | ICD-10-CM

## 2021-01-25 MED ORDER — MELOXICAM 7.5 MG PO TABS: 7.5000 mg | ORAL_TABLET | Freq: Two times a day (BID) | ORAL | 0 refills | Status: AC

## 2021-01-25 NOTE — Progress Notes (Signed)
Follow-up established patient old problem   Chief Complaint  Patient presents with   Shoulder Pain    Left     Jesus Ingram is 60 years old comes in with left shoulder pain which started after he was working in his shop he felt something in his shoulder and was concerned although he thinks it is getting better.  He does note that it wakes him up at night hurts somewhat during the day as well and he notices some weakness in certain activities  He also has some neck tenderness  We saw him for tendinitis in his left shoulder last year  On exam he has tenderness on the left side of his paracervical musculature he has no weakness in abduction or flexion he does have some internal rotation and a positive Neer sign but it 180 degrees  His x-ray on April 2021 was normal  Recommend Mobic twice daily for 2 weeks  Injection left shoulder subacromial space  Follow-up as needed  Procedure injection left subacromial space  Consent was given timeout was taken to confirm left shoulder  Ethyl chloride and alcohol were used to prep the skin and 40 mg of Depo-Medrol and 2 cc 1% lidocaine was injected into the left shoulder subacromial space

## 2021-01-25 NOTE — Patient Instructions (Signed)

## 2022-10-13 ENCOUNTER — Encounter: Payer: Self-pay | Admitting: Radiology

## 2023-04-03 ENCOUNTER — Encounter (INDEPENDENT_AMBULATORY_CARE_PROVIDER_SITE_OTHER): Payer: Self-pay | Admitting: *Deleted

## 2023-12-14 ENCOUNTER — Other Ambulatory Visit (HOSPITAL_COMMUNITY): Payer: Self-pay | Admitting: Internal Medicine

## 2023-12-14 DIAGNOSIS — N50819 Testicular pain, unspecified: Secondary | ICD-10-CM

## 2023-12-27 ENCOUNTER — Encounter (HOSPITAL_COMMUNITY): Payer: Self-pay | Admitting: Internal Medicine

## 2023-12-27 ENCOUNTER — Ambulatory Visit (HOSPITAL_COMMUNITY)
Admission: RE | Admit: 2023-12-27 | Discharge: 2023-12-27 | Disposition: A | Discharge status: Home / Self Care | Payer: Self-pay | Source: Ambulatory Visit | Attending: Internal Medicine | Admitting: Internal Medicine

## 2023-12-27 DIAGNOSIS — N50819 Testicular pain, unspecified: Secondary | ICD-10-CM | POA: Insufficient documentation
